# Patient Record
Sex: Male | Born: 1960
Health system: Southern US, Community
[De-identification: ages and names within clinical notes are randomized; demographics above are authoritative.]

## PROBLEM LIST (undated history)

## (undated) DIAGNOSIS — I10 Essential (primary) hypertension: Secondary | ICD-10-CM

## (undated) DIAGNOSIS — K5900 Constipation, unspecified: Secondary | ICD-10-CM

## (undated) DIAGNOSIS — K219 Gastro-esophageal reflux disease without esophagitis: Secondary | ICD-10-CM

## (undated) DIAGNOSIS — K649 Unspecified hemorrhoids: Secondary | ICD-10-CM

## (undated) DIAGNOSIS — E785 Hyperlipidemia, unspecified: Secondary | ICD-10-CM

## (undated) DIAGNOSIS — N189 Chronic kidney disease, unspecified: Secondary | ICD-10-CM

## (undated) HISTORY — DX: Gastro-esophageal reflux disease without esophagitis: K21.9

## (undated) HISTORY — DX: Chronic kidney disease, unspecified: N18.9

## (undated) HISTORY — DX: Essential (primary) hypertension: I10

## (undated) HISTORY — DX: Hyperlipidemia, unspecified: E78.5

## (undated) HISTORY — DX: Constipation, unspecified: K59.00

## (undated) HISTORY — PX: MULTIPLE TOOTH EXTRACTIONS: SHX2053

## (undated) HISTORY — DX: Unspecified hemorrhoids: K64.9

## (undated) HISTORY — PX: NO PAST SURGERIES: SHX2092

---

## 2019-11-19 DIAGNOSIS — Z0289 Encounter for other administrative examinations: Secondary | ICD-10-CM | POA: Insufficient documentation

## 2019-11-21 ENCOUNTER — Other Ambulatory Visit: Payer: Self-pay

## 2019-11-21 ENCOUNTER — Ambulatory Visit (INDEPENDENT_AMBULATORY_CARE_PROVIDER_SITE_OTHER): Payer: Medicaid Other | Admitting: Family Medicine

## 2019-11-21 VITALS — BP 152/92 | HR 94 | Ht 64.5 in | Wt 181.6 lb

## 2019-11-21 DIAGNOSIS — Z0289 Encounter for other administrative examinations: Secondary | ICD-10-CM | POA: Diagnosis present

## 2019-11-21 DIAGNOSIS — Z1211 Encounter for screening for malignant neoplasm of colon: Secondary | ICD-10-CM

## 2019-11-21 DIAGNOSIS — I10 Essential (primary) hypertension: Secondary | ICD-10-CM | POA: Insufficient documentation

## 2019-11-21 DIAGNOSIS — G8929 Other chronic pain: Secondary | ICD-10-CM

## 2019-11-21 DIAGNOSIS — Z8619 Personal history of other infectious and parasitic diseases: Secondary | ICD-10-CM | POA: Insufficient documentation

## 2019-11-21 DIAGNOSIS — K219 Gastro-esophageal reflux disease without esophagitis: Secondary | ICD-10-CM

## 2019-11-21 DIAGNOSIS — K5904 Chronic idiopathic constipation: Secondary | ICD-10-CM | POA: Insufficient documentation

## 2019-11-21 DIAGNOSIS — M545 Low back pain: Secondary | ICD-10-CM

## 2019-11-21 LAB — POCT URINALYSIS DIP (MANUAL ENTRY)
Bilirubin, UA: NEGATIVE
Blood, UA: NEGATIVE
Glucose, UA: NEGATIVE mg/dL
Ketones, POC UA: NEGATIVE mg/dL
Leukocytes, UA: NEGATIVE
Nitrite, UA: NEGATIVE
Protein Ur, POC: NEGATIVE mg/dL
Spec Grav, UA: 1.02 (ref 1.010–1.025)
Urobilinogen, UA: 0.2 E.U./dL
pH, UA: 5.5 (ref 5.0–8.0)

## 2019-11-21 MED ORDER — AMLODIPINE BESYLATE 5 MG PO TABS: 5.0000 mg | ORAL_TABLET | Freq: Every day | ORAL | 1 refills | Status: DC

## 2019-11-21 MED ORDER — LISINOPRIL-HYDROCHLOROTHIAZIDE 20-25 MG PO TABS: 1.0000 | ORAL_TABLET | Freq: Every day | ORAL | 1 refills | Status: DC

## 2019-11-21 MED ORDER — POLYETHYLENE GLYCOL 3350 17 G PO PACK: 17.0000 g | PACK | Freq: Every day | ORAL | 0 refills | Status: DC

## 2019-11-21 MED ORDER — ACETAMINOPHEN ER 650 MG PO TBCR: 650.0000 mg | EXTENDED_RELEASE_TABLET | Freq: Three times a day (TID) | ORAL | 1 refills | Status: DC | PRN

## 2019-11-21 MED ORDER — CALCIUM CARBONATE ANTACID 500 MG PO CHEW: 1.0000 | CHEWABLE_TABLET | Freq: Every day | ORAL | 1 refills | Status: DC

## 2019-11-21 NOTE — Assessment & Plan Note (Signed)
Refugee examination reviewed.  History reviewed and updated.  Labs ordered.  Patient will need to go to health department for TB testing.  Will need vaccinations at follow-up visit.

## 2019-11-21 NOTE — Assessment & Plan Note (Signed)
Uses suppositories as needed.  Will trial MiraLAX.  Patient is also never had a colonoscopy, will refer him for this especially while he has insurance.  Social worker to help schedule colonoscopy.

## 2019-11-21 NOTE — Assessment & Plan Note (Signed)
Patient mentioned at the end of visit.  Does not appear to be changed in recent years.  For this reason, can hold off till next visit.  For now will prescribe Tylenol as needed.  Will need formal examination at next visit.

## 2019-11-21 NOTE — Progress Notes (Signed)
Patient Name: David Owen Date of Birth: 05/31/1960 Date of Visit: 11/21/19 PCP: ,  J, DO  Chief Complaint: refugee intake examination and HTN, acid reflux   The patient's preferred language is French and Swahili. An interpreter was used for the entire visit.  Interpreter Name or ID: Jocelyne, in-person   Prefers French interpretor   Subjective: David Owen is a pleasant 59 y.o. presenting today for an initial refugee and immigrant clinic visit.    ROS: Sometimes has teary eyes and gets blurry with this.  No chest pain, shortness of breath.  No fevers, chills, large changes in weight.  No vomiting or diarrhea.  No rashes.   PMH:  HTN, diagnosed 6 years.  Has been on combo pill with amlodipine 5/losartan50/HCTZ50 Has never been on other medications for BP and no history of intolerance of medication Denies chest pain or shortness of breath No leg swelling  Acid Reflux has a history of reflux, has never been on medication for it, no OTC meds Has had symptoms for a very long times Comes and goes occasionally   Syphilis, found on overseas lab work, treated with PCN x3 Never had any symptoms  Hemorrhoid, history of them Has used suppository for constipation for avoid hemorrhoids Uses only when he is constipated and has pain Has never had a colonoscopy  Chronic back pain Patient reports that his low back pain, usually bilateral, very infrequent, has been happening for many years without changes He would like to discuss in further detail at his next appointment  PSH: None   FH: None in children or parents  Current Medications:  Amlodipine 5mg/Losartan 50mg/HCTZ 50mg   Refugee Information Number of Immediate Family Members: 6 Number of Immediate Family Members in [place]: 6 Date of Arrival: 11/09/19 [place] Location of Refugee Camp: Other Other Location of Refugee Camp:: [place] Duration in Camp: 20 years or  greater Reason for Leaving Home Country: Other Other Reason for Leaving Home Country:: War Primary Language: French, Swahili/Kiswahili Able to Read in Primary Language: Yes Able to Write in Primary Language: Yes Education: College Prior Work: Architectural Design and Civil Engineering Marital Status: Married Sexual Activity: Yes Tuberculosis Screening Overseas: Not Completed Tuberculosis Screening Health Department: Not Completed Health Department Labs Completed: No History of Trauma: None Do You Feel Jumpy or Nervous?: No Are You Very Watchful or 'Super Alert'?: No   Date of Overseas Exam: 01/24/2019 Review of [place]verse[place] Exam: 11/21/2019 Pre-Departure Treatment: See attestation    Vitals:   11/21/19 0943  BP: (!) 152/92  Pulse: 94  SpO2: 99%   HEENT: Sclera anicteric. Dentition is decent . Appears well hydrated. Neck: Supple, no thyromegaly, no cervical LAD Cardiac: Regular rate and rhythm. Normal S1/S2. No murmurs, rubs, or gallops appreciated. Lungs: Clear bilaterally to ascultation.  Abdomen: Normoactive bowel sounds. No tenderness to deep or light palpation. No rebound or guarding.  No splenomegaly Extremities: Warm, well perfused without edema.  Skin: Warm and dry, no rashes Psych: Pleasant and appropriate  MSK: Moves all 4 extremities equally, no lower extremity edema  David Owen was seen today for establish care.  Diagnoses and all orders for this visit:  Refugee health examination -     RPR -     HIV Antibody (routine testing w rflx) -     Hepatitis c antibody (reflex) -     HGB FRAC. W/SOLUBILITY -     CBC with Differential/Platelet -     Comprehensive   metabolic panel -     Varicella zoster antibody, IgG -     Hepatitis B surface antibody,quantitative -     POCT urinalysis dipstick -     Lipid panel -     TSH -     Hepatitis B core antibody, total -     Hepatitis B surface antigen -     Strongyloides, Ab, IgG -     POCT urinalysis dipstick  Essential  hypertension -     lisinopril-hydrochlorothiazide (ZESTORETIC) 20-25 MG tablet; Take 1 tablet by mouth daily. -     amLODipine (NORVASC) 5 MG tablet; Take 1 tablet (5 mg total) by mouth at bedtime.  History of syphilis  Gastroesophageal reflux disease, unspecified whether esophagitis present -     calcium carbonate (TUMS) 500 MG chewable tablet; Chew 1 tablet (200 mg of elemental calcium total) by mouth daily.  Screening for colon cancer -     Ambulatory referral to Gastroenterology  Chronic idiopathic constipation -     polyethylene glycol (MIRALAX) 17 g packet; Take 17 g by mouth daily.  Chronic bilateral low back pain without sciatica -     acetaminophen (TYLENOL 8 HOUR) 650 MG CR tablet; Take 1 tablet (650 mg total) by mouth every 8 (eight) hours as needed for pain.   Refugee health examination Refugee examination reviewed.  History reviewed and updated.  Labs ordered.  Patient will need to go to health department for TB testing.  Will need vaccinations at follow-up visit.  Essential hypertension BP not currently well controlled.  Will switch to lisinopril/hydrochlorothiazide 20/25 mg daily and Norvasc 5 mg daily..  Will check BMP with refugee examination labs.  Patient will follow up in 1 week.  We will continue to titrate as needed.  Gastroesophageal reflux disease Chronic issue.  Intermittent.  Will treat patient with Tums for now.  At next follow-up visit will perform urea breath test.  Patient also referred to GI for colonoscopy.  After urea breath test, could consider starting H2 blocker.  History of syphilis Treated with penicillin x3.  We will follow-up titer to ensure decrease from previous.  Was 1:4 at last check with overseas examination.  Chronic idiopathic constipation Uses suppositories as needed.  Will trial MiraLAX.  Patient is also never had a colonoscopy, will refer him for this especially while he has insurance.  Social worker to help schedule  colonoscopy.  Chronic bilateral low back pain without sciatica Patient mentioned at the end of visit.  Does not appear to be changed in recent years.  For this reason, can hold off till next visit.  For now will prescribe Tylenol as needed.  Will need formal examination at next visit.  Screening for colon cancer Colonoscopy form given to patient.  Social worker to help schedule colonoscopy.    Return to care in 1 week in [facility] with PCP   Vaccines: At follow up   Labs to Order if not completed at Health Department:   - CBC with differential (looking for eosinophilia) - CMET  - HIV - Hepatitis C for adults, + tattoo, substance use, other risk factors  - RPR (those 15 and over)  - Varicella IgG if 19 years or older, unless immunization documented - MMR titer (unless prior vaccination)  - Hepatitis B surface antigen, surface antibody, and core antibody  - GC/CT if sexually active or risk factors  - Quantiferon or TST  - Lipids - HbA1C if overweight or obese  - B12 if  Bhutanese or Nepalese  - Sickle cell screen  - Urine dipstick  - Age 6-16, lead (repeat 3-6 months later)     

## 2019-11-21 NOTE — Assessment & Plan Note (Signed)
BP not currently well controlled.  Will switch to lisinopril/hydrochlorothiazide 20/25 mg daily and Norvasc 5 mg daily..  Will check BMP with refugee examination labs.  Patient will follow up in 1 week.  We will continue to titrate as needed.

## 2019-11-21 NOTE — Patient Instructions (Addendum)
Thank you for coming to see me today. It was a pleasure. Today we talked about:   Stop taking your old blood pressure medicine and start taking your new medication.  You will have two different medicines and you will take both of them once a day.  You can use Tums daily as needed for reflux.  Your social worker will help you schedule your colonoscopy.  Please follow-up with me in 1 week.  If you have any questions or concerns, please do not hesitate to call the office at [phone].  Best,   Arizona Constable, DO     [facility] - for constipation.  Take once daily if constipated and if doesn't help, can increase to twice a day.

## 2019-11-21 NOTE — Addendum Note (Signed)
Addended by: Owens Shark, Kenitha Glendinning on: 11/21/2019 03:34 PM   Modules accepted: Orders

## 2019-11-21 NOTE — Assessment & Plan Note (Signed)
Colonoscopy form given to patient.  Social worker to help schedule colonoscopy.

## 2019-11-21 NOTE — Assessment & Plan Note (Signed)
Treated with penicillin x3.  We will follow-up titer to ensure decrease from previous.  Was 1:4 at last check with overseas examination.

## 2019-11-21 NOTE — Assessment & Plan Note (Signed)
Chronic issue.  Intermittent.  Will treat patient with Tums for now.  At next follow-up visit will perform urea breath test.  Patient also referred to GI for colonoscopy.  After urea breath test, could consider starting H2 blocker.

## 2019-11-22 LAB — VARICELLA ZOSTER ANTIBODY, IGG: Varicella zoster IgG: 468 index (ref >165)

## 2019-11-22 LAB — COMPREHENSIVE METABOLIC PANEL
ALT: 43 IU/L (ref 0–44)
AST: 24 IU/L (ref 0–40)
Albumin/Globulin Ratio: 1.8 (ref 1.2–2.2)
Albumin: 4.7 g/dL (ref 3.8–4.9)
Alkaline Phosphatase: 119 IU/L (ref 48–121)
BUN/Creatinine Ratio: 13 (ref 9–20)
BUN: 17 mg/dL (ref 6–24)
Bilirubin Total: 0.2 mg/dL (ref 0.0–1.2)
CO2: 25 mmol/L (ref 20–29)
Calcium: 10.4 mg/dL — ABNORMAL HIGH (ref 8.7–10.2)
Chloride: 97 mmol/L (ref 96–106)
Creatinine, Ser: 1.33 mg/dL — ABNORMAL HIGH (ref 0.76–1.27)
GFR calc Af Amer: 67 mL/min/{1.73_m2} (ref >59)
GFR calc non Af Amer: 58 mL/min/{1.73_m2} — ABNORMAL LOW (ref >59)
Globulin, Total: 2.6 g/dL (ref 1.5–4.5)
Glucose: 108 mg/dL — ABNORMAL HIGH (ref 65–99)
Potassium: 3.6 mmol/L (ref 3.5–5.2)
Sodium: 140 mmol/L (ref 134–144)
Total Protein: 7.3 g/dL (ref 6.0–8.5)

## 2019-11-22 LAB — CBC WITH DIFFERENTIAL/PLATELET
Basophils Absolute: 0.1 10*3/uL (ref 0.0–0.2)
Basos: 1 %
EOS (ABSOLUTE): 0.2 10*3/uL (ref 0.0–0.4)
Eos: 3 %
Hematocrit: 48.5 % (ref 37.5–51.0)
Hemoglobin: 16.6 g/dL (ref 13.0–17.7)
Immature Grans (Abs): 0 10*3/uL (ref 0.0–0.1)
Immature Granulocytes: 0 %
Lymphocytes Absolute: 2.2 10*3/uL (ref 0.7–3.1)
Lymphs: 31 %
MCH: 29.1 pg (ref 26.6–33.0)
MCHC: 34.2 g/dL (ref 31.5–35.7)
MCV: 85 fL (ref 79–97)
Monocytes Absolute: 0.6 10*3/uL (ref 0.1–0.9)
Monocytes: 9 %
Neutrophils Absolute: 3.8 10*3/uL (ref 1.4–7.0)
Neutrophils: 56 %
Platelets: 250 10*3/uL (ref 150–450)
RBC: 5.7 x10E6/uL (ref 4.14–5.80)
RDW: 14.2 % (ref 11.6–15.4)
WBC: 6.9 10*3/uL (ref 3.4–10.8)

## 2019-11-22 LAB — HEPATITIS B SURFACE ANTIBODY, QUANTITATIVE: Hepatitis B Surf Ab Quant: 175.1 m[IU]/mL (ref >9.9)

## 2019-11-22 LAB — LIPID PANEL
Chol/HDL Ratio: 6.4 ratio — ABNORMAL HIGH (ref 0.0–5.0)
Cholesterol, Total: 263 mg/dL — ABNORMAL HIGH (ref 100–199)
HDL: 41 mg/dL (ref >39)
LDL Chol Calc (NIH): 142 mg/dL — ABNORMAL HIGH (ref 0–99)
Triglycerides: 432 mg/dL — ABNORMAL HIGH (ref 0–149)
VLDL Cholesterol Cal: 80 mg/dL — ABNORMAL HIGH (ref 5–40)

## 2019-11-22 LAB — HEPATITIS B CORE ANTIBODY, TOTAL: Hep B Core Total Ab: NEGATIVE

## 2019-11-22 LAB — HIV ANTIBODY (ROUTINE TESTING W REFLEX): HIV Screen 4th Generation wRfx: NONREACTIVE

## 2019-11-22 LAB — RPR: RPR Ser Ql: NONREACTIVE

## 2019-11-22 LAB — HEPATITIS B SURFACE ANTIGEN: Hepatitis B Surface Ag: NEGATIVE

## 2019-11-22 LAB — TSH: TSH: 2.54 u[IU]/mL (ref 0.450–4.500)

## 2019-11-23 ENCOUNTER — Encounter: Payer: Self-pay | Admitting: Internal Medicine

## 2019-11-23 LAB — HGB FRACTIONATION CASCADE
Hgb A2: 3.2 % (ref 1.8–3.2)
Hgb A: 96.8 % (ref 96.4–98.8)
Hgb F: 0 % (ref 0.0–2.0)
Hgb S: 0 %

## 2019-11-23 LAB — HCV INTERPRETATION

## 2019-11-23 LAB — HCV AB W REFLEX TO QUANT PCR: HCV Ab: <0.1 s/co ratio (ref 0.0–0.9)

## 2019-11-24 LAB — STRONGYLOIDES, AB, IGG: Strongyloides, Ab, IgG: NEGATIVE

## 2019-11-27 NOTE — Progress Notes (Signed)
SUBJECTIVE:   CHIEF COMPLAINT / HPI:   Hypertension Patient changed from overseas medication to lisinopril/hydrochlorothiazide 20/25 mg daily and Norvasc 5 mg daily He reports that he has been able to get this and is taking every day BMP was within normal limits at last office visit No chest pain, SOB, leg edema  GERD Patient referred to GI for colonoscopy, also given Tums SW working on getting colonoscopy scheduled Has not tried the tums Has "indigestion" after he eats Has no abdominal pain with this Doesn't have this often and is more concerned about his constipation  Constipation Continues to have constipation Rarely has BMs Has abdominal pain and hemorrhoids from this Was not able to find miralax at the pharmacy  Hyperlipidemia ASCVD risk 18.3, moderate intensity statin recommended No chest pain or shortness of breath Has never been on cholesterol medication before  CKD Patient with CKD 3a on labs at last visit  Left Ear Pain Inside ear, for 2 days Having headache with this No fevers, congestion Has not been swimming or gotten anything in ear that he knows of Feels itching inside the ear Has not tried any medication for this  **CC was Back Pain, but patient states this is chronic and very rare, he does not want to talk about this today after agenda setting**  iPad [place] interpretor used for entirety of encounter  PERTINENT  PMH / PSH: Hypertension, acid reflux, syphilis previously treated, hemorrhoids and constipation, chronic back pain  OBJECTIVE:   BP (!) 158/106   Pulse 76   Ht 5' 4.5" (1.638 m)   Wt 182 lb 3.2 oz (82.6 kg)   SpO2 98%   BMI 30.79 kg/m    Physical Exam:  General: 59 y.o. male in NAD HEENT: Right TM clear, Left TM mildly erythematous, bulging, effusion, no pain with palpation of left mastoid process Neck: No cervical lymphadenopathy Cardio: RRR no m/r/g Lungs: CTAB, no wheezing, no rhonchi, no crackles, no IWOB on RA Skin:  warm and dry Extremities: No edema   ASSESSMENT/PLAN:   Essential hypertension BP today not yet at goal.  For ease of patient, will double Norvasc dose to 10 mg at this time.  Continue lisinopril/hydrochlorothiazide 20--25 mg daily.  Ensured that patient was aware on how to double Norvasc and that 10 mg tablets would be sent in for him to use after completion of his current bottle.  BMP was within normal limits at last check.  We will have him follow-up on 8/2.  Gastroesophageal reflux disease Patient has not tried Tums as he has not had symptoms of this.  He is more worried about his constipation.  We will treat constipation, if his continues can consider treating with H2 blocker and urea breath test.  Chronic idiopathic constipation Patient provided a picture of MiraLAX.  He is also been referred for colonoscopy which appears to be scheduled.  Mixed hyperlipidemia Patient's ASCVD risk requiring moderate intensity statin.  Discussed with patient.  He is agreeable to starting cholesterol medication.  Will start Lipitor 20 mg.  Plan to recheck LDL in 3 months.  Patient advised that if he experiences leg cramps to let me know.  Stage 3a chronic kidney disease BMP showed likely CKD 3a.  Given that we have lack of prior history on patient, plan to repeat BMP at his next visit.  If this remains in CKD 3 range, will likely go ahead and refer patient to nephrology as he has guaranteed Medicaid for 8 months.  Non-recurrent acute suppurative otitis media of left ear without spontaneous rupture of tympanic membrane Patient does appear to have a left-sided effusion and erythematous TM consistent with acute otitis media.  He is otherwise well-appearing, no evidence of mastoiditis.  Will treat for simple adult acute otitis media with Augmentin x7 days.  If he does not have improvement or if this is recurrent, would consider imaging and ENT referral at that time.     Cleophas Dunker, [place]

## 2019-11-28 ENCOUNTER — Encounter: Payer: Self-pay | Admitting: Family Medicine

## 2019-11-28 ENCOUNTER — Other Ambulatory Visit: Payer: Self-pay

## 2019-11-28 ENCOUNTER — Ambulatory Visit (INDEPENDENT_AMBULATORY_CARE_PROVIDER_SITE_OTHER): Payer: Medicaid Other | Admitting: Family Medicine

## 2019-11-28 VITALS — BP 158/106 | HR 76 | Ht 64.5 in | Wt 182.2 lb

## 2019-11-28 DIAGNOSIS — K219 Gastro-esophageal reflux disease without esophagitis: Secondary | ICD-10-CM | POA: Diagnosis not present

## 2019-11-28 DIAGNOSIS — K5904 Chronic idiopathic constipation: Secondary | ICD-10-CM

## 2019-11-28 DIAGNOSIS — I10 Essential (primary) hypertension: Secondary | ICD-10-CM

## 2019-11-28 DIAGNOSIS — N1831 Chronic kidney disease, stage 3a: Secondary | ICD-10-CM | POA: Diagnosis not present

## 2019-11-28 DIAGNOSIS — E782 Mixed hyperlipidemia: Secondary | ICD-10-CM | POA: Diagnosis not present

## 2019-11-28 DIAGNOSIS — H66002 Acute suppurative otitis media without spontaneous rupture of ear drum, left ear: Secondary | ICD-10-CM | POA: Diagnosis not present

## 2019-11-28 MED ORDER — AMOXICILLIN-POT CLAVULANATE 875-125 MG PO TABS: 1.0000 | ORAL_TABLET | Freq: Two times a day (BID) | ORAL | 0 refills | Status: AC

## 2019-11-28 MED ORDER — ATORVASTATIN CALCIUM 20 MG PO TABS: 20.0000 mg | ORAL_TABLET | Freq: Every day | ORAL | 3 refills | Status: DC

## 2019-11-28 MED ORDER — AMLODIPINE BESYLATE 10 MG PO TABS: 10.0000 mg | ORAL_TABLET | Freq: Every day | ORAL | 3 refills | Status: DC

## 2019-11-28 NOTE — Assessment & Plan Note (Signed)
Patient's ASCVD risk requiring moderate intensity statin.  Discussed with patient.  He is agreeable to starting cholesterol medication.  Will start Lipitor 20 mg.  Plan to recheck LDL in 3 months.  Patient advised that if he experiences leg cramps to let me know.

## 2019-11-28 NOTE — Assessment & Plan Note (Signed)
Patient has not tried Tums as he has not had symptoms of this.  He is more worried about his constipation.  We will treat constipation, if his continues can consider treating with H2 blocker and urea breath test.

## 2019-11-28 NOTE — Assessment & Plan Note (Signed)
BP today not yet at goal.  For ease of patient, will double Norvasc dose to 10 mg at this time.  Continue lisinopril/hydrochlorothiazide 20--25 mg daily.  Ensured that patient was aware on how to double Norvasc and that 10 mg tablets would be sent in for him to use after completion of his current bottle.  BMP was within normal limits at last check.  We will have him follow-up on 8/2.

## 2019-11-28 NOTE — Assessment & Plan Note (Signed)
Patient does appear to have a left-sided effusion and erythematous TM consistent with acute otitis media.  He is otherwise well-appearing, no evidence of mastoiditis.  Will treat for simple adult acute otitis media with Augmentin x7 days.  If he does not have improvement or if this is recurrent, would consider imaging and ENT referral at that time.

## 2019-11-28 NOTE — Assessment & Plan Note (Signed)
Patient provided a picture of MiraLAX.  He is also been referred for colonoscopy which appears to be scheduled.

## 2019-11-28 NOTE — Assessment & Plan Note (Signed)
BMP showed likely CKD 3a.  Given that we have lack of prior history on patient, plan to repeat BMP at his next visit.  If this remains in CKD 3 range, will likely go ahead and refer patient to nephrology as he has guaranteed Medicaid for 8 months.

## 2019-11-28 NOTE — Patient Instructions (Addendum)
Thank you for coming to see me today. It was a pleasure. Today we talked about:   Take two amlodipine (total of 10mg ) once a day.  Continue to take lisinopril-hydrochlorothiazide one pill once daily.  When you run out of amlodipine, there is a new prescription at the pharmacy that is for 10mg  tablets.  Take one pill once a day when you switch to this.  We have sent a new medicine to the pharmacy for your cholesterol.  Take this every day.  I have sent an antibiotic to the pharmacy for your ear infection.  Please follow-up with me  If you have any questions or concerns, please do not hesitate to call the office at [phone].  Best,   Arizona Constable, DO  for constipation

## 2019-12-18 ENCOUNTER — Ambulatory Visit (INDEPENDENT_AMBULATORY_CARE_PROVIDER_SITE_OTHER): Payer: Medicaid Other | Admitting: Family Medicine

## 2019-12-18 ENCOUNTER — Encounter: Payer: Self-pay | Admitting: Family Medicine

## 2019-12-18 ENCOUNTER — Other Ambulatory Visit: Payer: Self-pay

## 2019-12-18 VITALS — BP 145/80 | HR 100 | Ht 65.0 in | Wt 186.4 lb

## 2019-12-18 DIAGNOSIS — K5904 Chronic idiopathic constipation: Secondary | ICD-10-CM | POA: Diagnosis not present

## 2019-12-18 DIAGNOSIS — N1831 Chronic kidney disease, stage 3a: Secondary | ICD-10-CM

## 2019-12-18 DIAGNOSIS — I10 Essential (primary) hypertension: Secondary | ICD-10-CM | POA: Diagnosis not present

## 2019-12-18 DIAGNOSIS — H04123 Dry eye syndrome of bilateral lacrimal glands: Secondary | ICD-10-CM | POA: Diagnosis not present

## 2019-12-18 MED ORDER — RESTASIS 0.05 % OP EMUL: 1.0000 [drp] | Freq: Two times a day (BID) | OPHTHALMIC | 1 refills | Status: DC

## 2019-12-18 NOTE — Assessment & Plan Note (Signed)
Was doing well with MiraLAX, but ran out.  Encourage patient to obtain more.  Advised that he can take daily, and back off if stools become too soft.  Patient is also scheduled for colonoscopy on 9/7.

## 2019-12-18 NOTE — Patient Instructions (Addendum)
We will get labs today.  Bring your vaccinations to your next appointment.  Come back on 9/7 at 1:30pm to see me.  Get the eye drops from the pharmacy to use twice a day in both eyes.  I have placed a referral to the Eye doctor.  If you do not hear from them in the next 2 weeks, please give [place] a call.  You can use Tylenol 650mg  every 6 hours as needed for pain You can use Tums as needed for your stomach feeling upset

## 2019-12-18 NOTE — Assessment & Plan Note (Signed)
Given that it is unsure if he has a history of CKD, will recheck labs today.  Of note, patient has not been taking lisinopril-hydrochlorothiazide, therefore this would not be contributing to any increase in CKD.

## 2019-12-18 NOTE — Assessment & Plan Note (Signed)
BP not at goal today, 145/80.  Patient has not been taking lisinopril-hydrochlorothiazide as prescribed, only Norvasc.  Encourage patient to take both medications.  We will follow-up in 1 month.  BMP today.

## 2019-12-18 NOTE — Assessment & Plan Note (Addendum)
Symptoms seem consistent with dry eyes, but given that patient has hypertension and has not had an eye exam, will go ahead and refer him to ophthalmology while he has guaranteed insurance.  Can use Restasis twice daily.

## 2019-12-18 NOTE — Progress Notes (Addendum)
    SUBJECTIVE:   CHIEF COMPLAINT / HPI:   CKD 3a Patient with CKD 3a at last lab check, without other labs, will get BMP today to compare  HTN Current regimen: norvasc 10mg , Lisinopril-HCTZ 20-25mg  QD Increased norvasc from 5 to 10 at last visit Has not been taking lisinopril-HCTZ as he states that he didn't know how to take it Denies CP, SOB, leg edema, symptoms of hypotension Patient reports that he has been checking BP at home in the AM States it is usually 145/80, 150/90 Lowest number that he has seen 130/80  Vaccinations Patient needs refugee vaccinations States that they received vaccinations a week ago but didn't bring the list  Constipation Has been taking miralax, but ran out Is scheduled for a colonoscopy  Eye Pain Sometimes has pain and tears come from his eyes at times  PERTINENT  PMH / PSH:  Hypertension, acid reflux, syphilis previously treated, hemorrhoids and constipation, chronic back pain Hypertension, acid reflux, syphilis previously treated, hemorrhoids and constipation, chronic back pain  OBJECTIVE:   BP (!) 145/80   Pulse 100   Ht 5\' 5"  (1.651 m)   Wt 186 lb 6.4 oz (84.6 kg)   SpO2 99%   BMI 31.02 kg/m    Physical Exam:  General: 59 y.o. male in NAD Cardio: RRR no m/r/g Lungs: CTAB, no wheezing, no rhonchi, no crackles, no IWOB on RA Skin: warm and dry Extremities: No edema   ASSESSMENT/PLAN:   Essential hypertension BP not at goal today, 145/80.  Patient has not been taking lisinopril-hydrochlorothiazide as prescribed, only Norvasc.  Encourage patient to take both medications.  We will follow-up in 1 month.  BMP today.  Stage 3a chronic kidney disease Given that it is unsure if he has a history of CKD, will recheck labs today.  Of note, patient has not been taking lisinopril-hydrochlorothiazide, therefore this would not be contributing to any increase in CKD.  Chronic idiopathic constipation Was doing well with MiraLAX, but ran out.   Encourage patient to obtain more.  Advised that he can take daily, and back off if stools become too soft.  Patient is also scheduled for colonoscopy on 9/7.  Dry eye syndrome of both eyes Symptoms seem consistent with dry eyes, but given that patient has hypertension and has not had an eye exam, will go ahead and refer him to ophthalmology while he has guaranteed insurance.  Can use Restasis twice daily.     Patient states that he just received vaccinations last week, would like to hold off and bring in his records at the next visit to see what vaccines he needs.  [place] interpreter and Swahili interpreter used at different times to speak with patient throughout encounter with him and his wife.  Cleophas Dunker, [place]

## 2019-12-19 LAB — BASIC METABOLIC PANEL
BUN/Creatinine Ratio: 13 (ref 9–20)
BUN: 15 mg/dL (ref 6–24)
CO2: 23 mmol/L (ref 20–29)
Calcium: 10.1 mg/dL (ref 8.7–10.2)
Chloride: 103 mmol/L (ref 96–106)
Creatinine, Ser: 1.2 mg/dL (ref 0.76–1.27)
GFR calc Af Amer: 76 mL/min/{1.73_m2} (ref >59)
GFR calc non Af Amer: 66 mL/min/{1.73_m2} (ref >59)
Glucose: 123 mg/dL — ABNORMAL HIGH (ref 65–99)
Potassium: 4.2 mmol/L (ref 3.5–5.2)
Sodium: 142 mmol/L (ref 134–144)

## 2020-01-09 ENCOUNTER — Ambulatory Visit (AMBULATORY_SURGERY_CENTER): Payer: Self-pay | Admitting: *Deleted

## 2020-01-09 ENCOUNTER — Other Ambulatory Visit: Payer: Self-pay

## 2020-01-09 VITALS — Ht 65.0 in | Wt 184.0 lb

## 2020-01-09 DIAGNOSIS — Z1211 Encounter for screening for malignant neoplasm of colon: Secondary | ICD-10-CM

## 2020-01-09 DIAGNOSIS — Z01818 Encounter for other preprocedural examination: Secondary | ICD-10-CM

## 2020-01-09 MED ORDER — SUPREP BOWEL PREP KIT 17.5-3.13-1.6 GM/177ML PO SOLN: 1.0000 | Freq: Once | ORAL | 0 refills | Status: AC

## 2020-01-09 MED ORDER — POLYETHYLENE GLYCOL 3350 17 GM/SCOOP PO POWD: 1.0000 | Freq: Every day | ORAL | 0 refills | Status: DC

## 2020-01-09 NOTE — Progress Notes (Signed)
No egg or soy allergy known to patient  No issues with past sedation with any surgeries or procedures no intubation problems in the past  No FH of Malignant Hyperthermia No diet pills per patient No home 02 use per patient  No blood thinners per patient  Pt denies issues with constipation  No A fib or A flutter  EMMI video to pt or via [place] 19 guidelines implemented in [place] today with Pt and RN   cov test 9-2 Thursday 12 noon- Social Worker SArah will take pt to [employer] test = pt states his daughter reads english- he will have her read the instructions and call with any questions   Interpreter in [place] todayRica Mote to interpret   Due to the COVID-19 pandemic we are asking patients to follow these guidelines. Please only bring one care partner. Please be aware that your care partner may wait in the car in the parking lot or if they feel like they will be too hot to wait in the car, they may wait in the lobby on the 4th floor. All care partners are required to wear a mask the entire time (we do not have any that we can provide them), they need to practice social distancing, and we will do a Covid check for all patient's and care partners when you arrive. Also we will check their temperature and your temperature. If the care partner waits in their car they need to stay in the parking lot the entire time and we will call them on their cell phone when the patient is ready for discharge so they can bring the car to the front of the building. Also all patient's will need to wear a mask into building.

## 2020-01-17 DIAGNOSIS — I259 Chronic ischemic heart disease, unspecified: Secondary | ICD-10-CM

## 2020-01-17 DIAGNOSIS — R000 Tachycardia, unspecified: Secondary | ICD-10-CM

## 2020-01-17 HISTORY — PX: COLONOSCOPY: SHX174

## 2020-01-17 HISTORY — DX: Chronic ischemic heart disease, unspecified: I25.9

## 2020-01-17 HISTORY — DX: Tachycardia, unspecified: R00.0

## 2020-01-18 ENCOUNTER — Other Ambulatory Visit: Payer: Self-pay | Admitting: Internal Medicine

## 2020-01-18 ENCOUNTER — Ambulatory Visit (INDEPENDENT_AMBULATORY_CARE_PROVIDER_SITE_OTHER): Payer: Medicaid Other

## 2020-01-18 DIAGNOSIS — Z1159 Encounter for screening for other viral diseases: Secondary | ICD-10-CM

## 2020-01-18 LAB — SARS CORONAVIRUS 2 (TAT 6-24 HRS): SARS Coronavirus 2: NEGATIVE

## 2020-01-23 ENCOUNTER — Ambulatory Visit: Payer: Medicaid Other | Admitting: Family Medicine

## 2020-01-23 ENCOUNTER — Encounter: Payer: Medicaid Other | Admitting: Internal Medicine

## 2020-01-23 ENCOUNTER — Telehealth: Payer: Self-pay | Admitting: Internal Medicine

## 2020-01-25 ENCOUNTER — Other Ambulatory Visit: Payer: Self-pay

## 2020-01-25 ENCOUNTER — Encounter: Payer: Self-pay | Admitting: Family Medicine

## 2020-01-25 ENCOUNTER — Ambulatory Visit (INDEPENDENT_AMBULATORY_CARE_PROVIDER_SITE_OTHER): Payer: Medicaid Other | Admitting: Family Medicine

## 2020-01-25 VITALS — BP 148/96 | HR 100 | Ht 65.0 in | Wt 183.4 lb

## 2020-01-25 DIAGNOSIS — M940 Chondrocostal junction syndrome [Tietze]: Secondary | ICD-10-CM

## 2020-01-25 DIAGNOSIS — I10 Essential (primary) hypertension: Secondary | ICD-10-CM | POA: Diagnosis not present

## 2020-01-25 DIAGNOSIS — H04123 Dry eye syndrome of bilateral lacrimal glands: Secondary | ICD-10-CM

## 2020-01-25 MED ORDER — LISINOPRIL-HYDROCHLOROTHIAZIDE 20-25 MG PO TABS: 1.0000 | ORAL_TABLET | Freq: Every day | ORAL | 1 refills | Status: DC

## 2020-01-25 NOTE — Patient Instructions (Addendum)
Thank you for coming to see me today. It was a pleasure. Today we talked about:   Pick up your prescriptions from the pharmacy.  Take them for your blood pressure  We will get blood work today and talk about it at your next visit.  If chest pain occurs without touching, worsens, or moves, come in right away or go to the hospital.  Please follow-up with me in 1 month.  If you have any questions or concerns, please do not hesitate to call the office at [phone].  Best,   Ari[place] Constable, DO

## 2020-01-25 NOTE — Progress Notes (Signed)
    SUBJECTIVE:   CHIEF COMPLAINT / HPI:   HTN Current regimen: Lisinopril-HCTZ, Norvasc 10 Ran out of lisinpril-HCTZ 1 week ago BP at home 150/90 this AM, before running out of medication, it was 135/80 Denies chest pain, shortness of breath, leg edema, symptoms of hypotension  Colonoscopy was postponed as he didn't understand what to do about the prep He is rescheduled for 9/16  Dry Eyes  Referred to ophthalmology, was prescribed glasses which he is waiting on and received medication as well, Restasis  Chest Pain 2 weeks, only hurts when he presses in the area No pain with exertion, no pain at rest if he is not pushing on the area No shortness of breath No heavy lifting or new exercises No radiation of pain Pain is central to the costochondral junction  Received 1st COVID vaccine today at [facility] interpreter used for entirety of encounter  PERTINENT  PMH / PSH: HTN, acid reflux, history of syphilis previously treated, hemorrhoids and constipation, chronic back pain  OBJECTIVE:   BP (!) 148/96   Pulse 100   Ht 5\' 5"  (1.651 m)   Wt 183 lb 6.4 oz (83.2 kg)   SpO2 98%   BMI 30.52 kg/m    Physical Exam:  General: 59 y.o. male in NAD Cardio: RRR no m/r/g Lungs: CTAB, no wheezing, no rhonchi, no crackles, no IWOB on RA, tenderness to palpation of bilateral costochondral junction Skin: warm and dry Extremities: No edema   ASSESSMENT/PLAN:   Essential hypertension BP is not at goal today.  He has been without lisinopril hydrochlorothiazide for 1 week.  Explained at length have refills work to the patient so that he knows to obtain medication when he runs out from the pharmacy.  Patient reports that his blood pressure was previously controlled before running out of medication.  We will continue with current regimen, follow-up in 1 month.  Will obtain BMP today as he was taking lisinopril hydrochlorothiazide regularly before running out 1 week ago.  Dry eye syndrome of  both eyes Has been seen by ophthalmology.  States that he continues to use Restasis which is working well for his eyes.  Costochondritis Only occurs when pressing on his chest.  Ensured that this was verified with the patient multiple times.  He does not have any pain with exertion or at rest, only when pressing on his chest.  Has no associated symptoms.  Pain is reproducible to palpation on my exam as well and is located in the costochondral junction.  Most likely costochondritis.  Gave patient return precautions including worsening of pain, changing of character, pain that occurs without pressing, associated shortness of breath.  Advised to come to office or ED right away if this occurs.  Given that he is not having pain other than when he presses, advised patient to stop pressing on the area.  Could treat with a small burst of NSAIDs if needed if pain does not improve.     David Owen, [place]

## 2020-01-26 DIAGNOSIS — M940 Chondrocostal junction syndrome [Tietze]: Secondary | ICD-10-CM | POA: Insufficient documentation

## 2020-01-26 LAB — BASIC METABOLIC PANEL
BUN/Creatinine Ratio: 11 (ref 9–20)
BUN: 13 mg/dL (ref 6–24)
CO2: 24 mmol/L (ref 20–29)
Calcium: 10.3 mg/dL — ABNORMAL HIGH (ref 8.7–10.2)
Chloride: 103 mmol/L (ref 96–106)
Creatinine, Ser: 1.2 mg/dL (ref 0.76–1.27)
GFR calc Af Amer: 76 mL/min/{1.73_m2} (ref >59)
GFR calc non Af Amer: 66 mL/min/{1.73_m2} (ref >59)
Glucose: 87 mg/dL (ref 65–99)
Potassium: 4.3 mmol/L (ref 3.5–5.2)
Sodium: 143 mmol/L (ref 134–144)

## 2020-01-26 NOTE — Assessment & Plan Note (Signed)
BP is not at goal today.  He has been without lisinopril hydrochlorothiazide for 1 week.  Explained at length have refills work to the patient so that he knows to obtain medication when he runs out from the pharmacy.  Patient reports that his blood pressure was previously controlled before running out of medication.  We will continue with current regimen, follow-up in 1 month.  Will obtain BMP today as he was taking lisinopril hydrochlorothiazide regularly before running out 1 week ago.

## 2020-01-26 NOTE — Assessment & Plan Note (Signed)
Has been seen by ophthalmology.  States that he continues to use Restasis which is working well for his eyes.

## 2020-01-26 NOTE — Assessment & Plan Note (Signed)
Only occurs when pressing on his chest.  Ensured that this was verified with the patient multiple times.  He does not have any pain with exertion or at rest, only when pressing on his chest.  Has no associated symptoms.  Pain is reproducible to palpation on my exam as well and is located in the costochondral junction.  Most likely costochondritis.  Gave patient return precautions including worsening of pain, changing of character, pain that occurs without pressing, associated shortness of breath.  Advised to come to office or ED right away if this occurs.  Given that he is not having pain other than when he presses, advised patient to stop pressing on the area.  Could treat with a small burst of NSAIDs if needed if pain does not improve.

## 2020-01-30 ENCOUNTER — Other Ambulatory Visit: Payer: Self-pay | Admitting: Internal Medicine

## 2020-01-30 LAB — SARS CORONAVIRUS 2 (TAT 6-24 HRS): SARS Coronavirus 2: NEGATIVE

## 2020-02-01 ENCOUNTER — Encounter: Payer: Self-pay | Admitting: Internal Medicine

## 2020-02-01 ENCOUNTER — Ambulatory Visit (AMBULATORY_SURGERY_CENTER): Payer: Medicaid Other | Admitting: Internal Medicine

## 2020-02-01 ENCOUNTER — Other Ambulatory Visit: Payer: Self-pay

## 2020-02-01 VITALS — BP 132/61 | HR 110 | Temp 97.5°F | Resp 14 | Ht 65.0 in | Wt 184.0 lb

## 2020-02-01 DIAGNOSIS — Z1211 Encounter for screening for malignant neoplasm of colon: Secondary | ICD-10-CM

## 2020-02-01 DIAGNOSIS — D125 Benign neoplasm of sigmoid colon: Secondary | ICD-10-CM

## 2020-02-01 DIAGNOSIS — D123 Benign neoplasm of transverse colon: Secondary | ICD-10-CM

## 2020-02-01 MED ORDER — SODIUM CHLORIDE 0.9 % IV SOLN: 500.0000 mL | Freq: Once | INTRAVENOUS | Status: DC

## 2020-02-01 NOTE — Progress Notes (Signed)
Pt's states no medical or surgical changes since previsit or office visit. 

## 2020-02-01 NOTE — Progress Notes (Signed)
Called to room to assist during endoscopic procedure.  Patient ID and intended procedure confirmed with present staff. Received instructions for my participation in the procedure from the performing physician.  

## 2020-02-01 NOTE — Progress Notes (Signed)
PT taken to PACU. Monitors in place. VSS. Report given to RN. 

## 2020-02-01 NOTE — Op Note (Signed)
[facility] Patient Name: David Owen Procedure Date: 02/01/2020 8:31 AM MRN: [id number] Endoscopist: Docia Chuck. Henrene Pastor , MD Age: 59 Referring MD:  Date of Birth: [birth year] Gender: Male Account #: [id number] Procedure:                Colonoscopy with cold snare polypectomy x 2 Indications:              Screening for colorectal malignant neoplasm Medicines:                Monitored Anesthesia Care Procedure:                Pre-Anesthesia Assessment:                           - Prior to the procedure, a History and Physical                            was performed, and patient medications and                            allergies were reviewed. The patient's tolerance of                            previous anesthesia was also reviewed. The risks                            and benefits of the procedure and the sedation                            options and risks were discussed with the patient.                            All questions were answered, and informed consent                            was obtained. Prior Anticoagulants: The patient has                            taken no previous anticoagulant or antiplatelet                            agents. ASA Grade Assessment: II - A patient with                            mild systemic disease. After reviewing the risks                            and benefits, the patient was deemed in                            satisfactory condition to undergo the procedure.                           After obtaining informed consent, the colonoscope  was passed under direct vision. Throughout the                            procedure, the patient's blood pressure, pulse, and                            oxygen saturations were monitored continuously. The                            Colonoscope was introduced through the anus and                            advanced to the the cecum, identified by                             appendiceal orifice and ileocecal valve. The                            ileocecal valve, appendiceal orifice, and rectum                            were photographed. The quality of the bowel                            preparation was excellent. The colonoscopy was                            performed without difficulty. The patient tolerated                            the procedure well. The bowel preparation used was                            SUPREP via split dose instruction. Scope In: 8:42:21 AM Scope Out: 8:55:03 AM Scope Withdrawal Time: 0 hours 8 minutes 46 seconds  Total Procedure Duration: 0 hours 12 minutes 42 seconds  Findings:                 Two polyps were found in the sigmoid colon and                            transverse colon. The polyps were 2 to 4 mm in                            size. These polyps were removed with a cold snare.                            Resection and retrieval were complete.                           The exam was otherwise without abnormality on                            direct and retroflexion views. Complications:  No immediate complications. Estimated blood loss:                            None. Estimated Blood Loss:     Estimated blood loss: none. Impression:               - Two 2 to 4 mm polyps in the sigmoid colon and in                            the transverse colon, removed with a cold snare.                            Resected and retrieved.                           - The examination was otherwise normal on direct                            and retroflexion views. Recommendation:           - Repeat colonoscopy in 7 years for surveillance.                           - Patient has a contact number available for                            emergencies. The signs and symptoms of potential                            delayed complications were discussed with the                            patient. Return to normal activities  tomorrow.                            Written discharge instructions were provided to the                            patient.                           - Resume previous diet.                           - Continue present medications.                           - Await pathology results. Docia Chuck. Henrene Pastor, MD 02/01/2020 9:02:11 AM This report has been signed electronically.

## 2020-02-01 NOTE — Patient Instructions (Signed)
Please read handouts provided. Continue present medications. Await pathology results.   YOU HAD AN ENDOSCOPIC PROCEDURE TODAY AT THE [facility]:   Refer to the procedure report that was given to you for any specific questions about what was found during the examination.  If the procedure report does not answer your questions, please call your gastroenterologist to clarify.  If you requested that your care partner not be given the details of your procedure findings, then the procedure report has been included in a sealed envelope for you to review at your convenience later.  YOU SHOULD EXPECT: Some feelings of bloating in the abdomen. Passage of more gas than usual.  Walking can help get rid of the air that was put into your GI tract during the procedure and reduce the bloating. If you had a lower endoscopy (such as a colonoscopy or flexible sigmoidoscopy) you may notice spotting of blood in your stool or on the toilet paper. If you underwent a bowel prep for your procedure, you may not have a normal bowel movement for a few days.  Please Note:  You might notice some irritation and congestion in your nose or some drainage.  This is from the oxygen used during your procedure.  There is no need for concern and it should clear up in a day or so.  SYMPTOMS TO REPORT IMMEDIATELY:  Following lower endoscopy (colonoscopy or flexible sigmoidoscopy):  Excessive amounts of blood in the stool  Significant tenderness or worsening of abdominal pains  Swelling of the abdomen that is new, acute  Fever of 100F or higher   For urgent or emergent issues, a gastroenterologist can be reached at any hour by calling [phone]. Do not use MyChart messaging for urgent concerns.    DIET:  We do recommend a small meal at first, but then you may proceed to your regular diet.  Drink plenty of fluids but you should avoid alcoholic beverages for 24 hours.  ACTIVITY:  You should plan to take it easy  for the rest of today and you should NOT DRIVE or use heavy machinery until tomorrow (because of the sedation medicines used during the test).    FOLLOW UP: Our staff will call the number listed on your records 48-72 hours following your procedure to check on you and address any questions or concerns that you may have regarding the information given to you following your procedure. If we do not reach you, we will leave a message.  We will attempt to reach you two times.  During this call, we will ask if you have developed any symptoms of COVID 19. If you develop any symptoms (ie: fever, flu-like symptoms, shortness of breath, cough etc.) before then, please call [phone].  If you test positive for Covid 19 in the 2 weeks post procedure, please call and report this information to us.    If any biopsies were taken you will be contacted by phone or by letter within the next 1-3 weeks.  Please call us at [phone] if you have not heard about the biopsies in 3 weeks.    SIGNATURES/CONFIDENTIALITY: You and/or your care partner have signed paperwork which will be entered into your electronic medical record.  These signatures attest to the fact that that the information above on your After Visit Summary has been reviewed and is understood.  Full responsibility of the confidentiality of this discharge information lies with you and/or your care-partner.  

## 2020-02-05 ENCOUNTER — Telehealth: Payer: Self-pay

## 2020-02-05 NOTE — Telephone Encounter (Signed)
  Follow up Call-  Call back number 02/01/2020  Post procedure Call Back phone  # Philippa Chester (daughter) [phone]  Permission to leave phone message Yes     Patient questions:  Do you have a fever, pain , or abdominal swelling? No. Pain Score  0 *  Have you tolerated food without any problems? Yes.    Have you been able to return to your normal activities? Yes.    Do you have any questions about your discharge instructions: Diet   No. Medications  No. Follow up visit  No.  Do you have questions or concerns about your Care? No.  Actions: * If pain score is 4 or above: No action needed, pain <4.   1. Have you developed a fever since your procedure? No   2.   Have you had an respiratory symptoms (SOB or cough) since your procedure? No   3.   Have you tested positive for COVID 19 since your procedure? No   4.   Have you had any family members/close contacts diagnosed with the COVID 19 since your procedure?  No    If yes to any of these questions please route to Joylene John, RN and Joella Prince, RN

## 2020-02-05 NOTE — Telephone Encounter (Signed)
No answer on follow up call. 

## 2020-02-06 ENCOUNTER — Encounter: Payer: Self-pay | Admitting: Internal Medicine

## 2020-02-15 ENCOUNTER — Emergency Department (HOSPITAL_COMMUNITY)
Admission: EM | Admit: 2020-02-15 | Discharge: 2020-02-15 | Disposition: A | Discharge status: Home / Self Care | Payer: Medicaid Other | Attending: Emergency Medicine | Admitting: Emergency Medicine

## 2020-02-15 ENCOUNTER — Emergency Department (HOSPITAL_COMMUNITY): Payer: Medicaid Other

## 2020-02-15 DIAGNOSIS — N1831 Chronic kidney disease, stage 3a: Secondary | ICD-10-CM | POA: Diagnosis not present

## 2020-02-15 DIAGNOSIS — R0789 Other chest pain: Secondary | ICD-10-CM | POA: Insufficient documentation

## 2020-02-15 DIAGNOSIS — Z79899 Other long term (current) drug therapy: Secondary | ICD-10-CM | POA: Insufficient documentation

## 2020-02-15 DIAGNOSIS — I129 Hypertensive chronic kidney disease with stage 1 through stage 4 chronic kidney disease, or unspecified chronic kidney disease: Secondary | ICD-10-CM | POA: Diagnosis not present

## 2020-02-15 LAB — TROPONIN I (HIGH SENSITIVITY)
Troponin I (High Sensitivity): 7 ng/L (ref <18)
Troponin I (High Sensitivity): 6 ng/L (ref <18)

## 2020-02-15 LAB — BASIC METABOLIC PANEL
Anion gap: 11 (ref 5–15)
BUN: 16 mg/dL (ref 6–20)
CO2: 26 mmol/L (ref 22–32)
Calcium: 10.5 mg/dL — ABNORMAL HIGH (ref 8.9–10.3)
Chloride: 104 mmol/L (ref 98–111)
Creatinine, Ser: 1.43 mg/dL — ABNORMAL HIGH (ref 0.61–1.24)
GFR calc Af Amer: >60 mL/min (ref >60)
GFR calc non Af Amer: 53 mL/min — ABNORMAL LOW (ref >60)
Glucose, Bld: 152 mg/dL — ABNORMAL HIGH (ref 70–99)
Potassium: 4.3 mmol/L (ref 3.5–5.1)
Sodium: 141 mmol/L (ref 135–145)

## 2020-02-15 LAB — CBC
HCT: 43.4 % (ref 39.0–52.0)
Hemoglobin: 14.5 g/dL (ref 13.0–17.0)
MCH: 28.8 pg (ref 26.0–34.0)
MCHC: 33.4 g/dL (ref 30.0–36.0)
MCV: 86.1 fL (ref 80.0–100.0)
Platelets: 228 10*3/uL (ref 150–400)
RBC: 5.04 MIL/uL (ref 4.22–5.81)
RDW: 14.5 % (ref 11.5–15.5)
WBC: 8.3 10*3/uL (ref 4.0–10.5)
nRBC: 0 % (ref 0.0–0.2)

## 2020-02-15 MED ORDER — BENZONATATE 100 MG PO CAPS: 100.0000 mg | ORAL_CAPSULE | Freq: Three times a day (TID) | ORAL | 0 refills | Status: DC

## 2020-02-15 NOTE — ED Triage Notes (Signed)
Pt here from home with c/o chest pain epigastric area non radiating , no n/v or sob

## 2020-02-15 NOTE — ED Notes (Signed)
Pt speaks french

## 2020-02-15 NOTE — ED Provider Notes (Signed)
[facility] EMERGENCY DEPARTMENT Provider Note   CSN: [id number] Arrival date & time: 02/15/20  1221     History No chief complaint on file.   Coty Larsh is a 59 y.o. male.  59 year old male presents with recurring chest discomfort for over a month.  Patient is [place] speaking and interpreter was used.  Patient states that he has been seen by his doctor for similar symptoms and he does follow-up in family practice clinic and those notes were reviewed.  Patient states that symptoms can last from 5 minutes to 20 minutes and seem to wax and wane.  No exertional component to them.  Sometimes are made better after drinking water.  Sometimes made better with lying supine.  He denies any abdominal discomfort.  No vomiting or diarrhea noted.  Denies any fever, cough, congestion.  No leg pain or swelling.  Has a follow-up appointment with his doctor scheduled in about 10 days.        Past Medical History:  Diagnosis Date  . Acid reflux   . Chronic kidney disease    stage III  . Constipation    on miralax daily   . Hyperlipidemia   . Hypertension     Patient Active Problem List   Diagnosis Date Noted  . Costochondritis 01/26/2020  . Dry eye syndrome of both eyes 12/18/2019  . Mixed hyperlipidemia 11/28/2019  . Stage 3a chronic kidney disease 11/28/2019  . Essential hypertension 11/21/2019  . History of syphilis 11/21/2019  . Gastroesophageal reflux disease 11/21/2019  . Chronic idiopathic constipation 11/21/2019  . Chronic bilateral low back pain without sciatica 11/21/2019  . Refugee health examination 11/19/2019    Past Surgical History:  Procedure Laterality Date  . NO PAST SURGERIES         Family History  Problem Relation Age of Onset  . Colon cancer Neg Hx   . Colon polyps Neg Hx   . Esophageal cancer Neg Hx   . Rectal cancer Neg Hx   . Stomach cancer Neg Hx     Social History   Tobacco Use  . Smoking status: Never Smoker  .  Smokeless tobacco: Never Used  Substance Use Topics  . Alcohol use: Never  . Drug use: Never    Home Medications Prior to Admission medications   Medication Sig Start Date End Date Taking? Authorizing Provider  acetaminophen (TYLENOL 8 HOUR) 650 MG CR tablet Take 1 tablet (650 mg total) by mouth every 8 (eight) hours as needed for pain. 11/21/19   Meccariello, Bernita Raisin, DO  amLODipine (NORVASC) 10 MG tablet Take 1 tablet (10 mg total) by mouth at bedtime. 11/28/19   Meccariello, Bernita Raisin, DO  atorvastatin (LIPITOR) 20 MG tablet Take 1 tablet (20 mg total) by mouth daily. 11/28/19   Meccariello, Bernita Raisin, DO  calcium carbonate (TUMS) 500 MG chewable tablet Chew 1 tablet (200 mg of elemental calcium total) by mouth daily. 11/21/19   Meccariello, Bernita Raisin, DO  cycloSPORINE (RESTASIS) 0.05 % ophthalmic emulsion Place 1 drop into both eyes 2 (two) times daily. 12/18/19   Meccariello, Bernita Raisin, DO  lisinopril-hydrochlorothiazide (ZESTORETIC) 20-25 MG tablet Take 1 tablet by mouth daily. 01/25/20   Meccariello, Bernita Raisin, DO    Allergies    Patient has no allergy information on record.  Review of Systems   Review of Systems  All other systems reviewed and are negative.   Physical Exam Updated Vital Signs BP (!) 186/101   Pulse  98   Temp 98.6 F (37 C) (Oral)   Resp 17   SpO2 100%   Physical Exam Vitals and nursing note reviewed.  Constitutional:      General: He is not in acute distress.    Appearance: Normal appearance. He is well-developed. He is not toxic-appearing.  HENT:     Head: Normocephalic and atraumatic.  Eyes:     General: Lids are normal.     Conjunctiva/sclera: Conjunctivae normal.     Pupils: Pupils are equal, round, and reactive to light.  Neck:     Thyroid: No thyroid mass.     Trachea: No tracheal deviation.  Cardiovascular:     Rate and Rhythm: Normal rate and regular rhythm.     Heart sounds: Normal heart sounds. No murmur heard.  No gallop.   Pulmonary:      Effort: Pulmonary effort is normal. No respiratory distress.     Breath sounds: Normal breath sounds. No stridor. No decreased breath sounds, wheezing, rhonchi or rales.  Abdominal:     General: Bowel sounds are normal. There is no distension.     Palpations: Abdomen is soft.     Tenderness: There is no abdominal tenderness. There is no rebound.  Musculoskeletal:        General: No tenderness. Normal range of motion.     Cervical back: Normal range of motion and neck supple.  Skin:    General: Skin is warm and dry.     Findings: No abrasion or rash.  Neurological:     Mental Status: He is alert and oriented to person, place, and time.     GCS: GCS eye subscore is 4. GCS verbal subscore is 5. GCS motor subscore is 6.     Cranial Nerves: No cranial nerve deficit.     Sensory: No sensory deficit.  Psychiatric:        Speech: Speech normal.        Behavior: Behavior normal.     ED Results / Procedures / Treatments   Labs (all labs ordered are listed, but only abnormal results are displayed) Labs Reviewed  BASIC METABOLIC PANEL - Abnormal; Notable for the following components:      Result Value   Glucose, Bld 152 (*)    Creatinine, Ser 1.43 (*)    Calcium 10.5 (*)    GFR calc non Af Amer 53 (*)    All other components within normal limits  CBC  TROPONIN I (HIGH SENSITIVITY)  TROPONIN I (HIGH SENSITIVITY)    EKG EKG Interpretation  Date/Time:  Thursday February 15 2020 12:48:12 EDT Ventricular Rate:  108 PR Interval:  152 QRS Duration: 82 QT Interval:  340 QTC Calculation: 455 R Axis:   86 Text Interpretation: Sinus tachycardia ST & T wave abnormality, consider inferolateral ischemia Abnormal ECG No old tracing to compare Confirmed by Lacretia Leigh ([id number]) on 02/15/2020 6:33:02 PM   Radiology DG Chest 2 View  Result Date: 02/15/2020 CLINICAL DATA:  Chest pain, cough for 1 month EXAM: CHEST - 2 VIEW COMPARISON:  None. FINDINGS: The heart size and mediastinal contours  are within normal limits. No focal airspace consolidation, pleural effusion, or pneumothorax. The visualized skeletal structures are unremarkable. IMPRESSION: No active cardiopulmonary disease. Electronically Signed   By: Davina Poke D.O.   On: 02/15/2020 13:44    Procedures Procedures (including critical care time)  Medications Ordered in ED Medications - No data to display  ED Course  I have reviewed the  triage vital signs and the nursing notes.  Pertinent labs & imaging results that were available during my care of the patient were reviewed by me and considered in my medical decision making (see chart for details).    MDM Rules/Calculators/A&P                          Patient is delta troponin here negative.  Patient states that his symptoms actually are worse with coughing.  Will prescribe antitussive and he will need to follow-up with his doctor.  Low suspicion for ACS or PE Final Clinical Impression(s) / ED Diagnoses Final diagnoses:  None    Rx / DC Orders ED Discharge Orders    None       Lacretia Leigh, MD 02/15/20 2051

## 2020-02-28 ENCOUNTER — Ambulatory Visit (INDEPENDENT_AMBULATORY_CARE_PROVIDER_SITE_OTHER): Payer: Medicaid Other | Admitting: Family Medicine

## 2020-02-28 ENCOUNTER — Encounter: Payer: Self-pay | Admitting: Family Medicine

## 2020-02-28 ENCOUNTER — Other Ambulatory Visit: Payer: Self-pay

## 2020-02-28 VITALS — BP 122/68 | HR 114 | Ht 65.0 in | Wt 185.6 lb

## 2020-02-28 DIAGNOSIS — G629 Polyneuropathy, unspecified: Secondary | ICD-10-CM | POA: Diagnosis not present

## 2020-02-28 DIAGNOSIS — I10 Essential (primary) hypertension: Secondary | ICD-10-CM

## 2020-02-28 DIAGNOSIS — K219 Gastro-esophageal reflux disease without esophagitis: Secondary | ICD-10-CM | POA: Diagnosis not present

## 2020-02-28 DIAGNOSIS — R0789 Other chest pain: Secondary | ICD-10-CM | POA: Diagnosis present

## 2020-02-28 DIAGNOSIS — E782 Mixed hyperlipidemia: Secondary | ICD-10-CM

## 2020-02-28 DIAGNOSIS — N529 Male erectile dysfunction, unspecified: Secondary | ICD-10-CM | POA: Diagnosis not present

## 2020-02-28 MED ORDER — SILDENAFIL CITRATE 50 MG PO TABS: 50.0000 mg | ORAL_TABLET | Freq: Every day | ORAL | 0 refills | Status: DC | PRN

## 2020-02-28 MED ORDER — FAMOTIDINE 40 MG PO TABS: 40.0000 mg | ORAL_TABLET | Freq: Every day | ORAL | 3 refills | Status: DC

## 2020-02-28 NOTE — Assessment & Plan Note (Signed)
Previous EKG was negative for acute changes and troponins were negative.  He is not currently having chest pain.  Does have tenderness palpation of costochondral junction, therefore could consider costochondritis.  Could also consider reflux as he has a history of reflux-like symptoms and cough, pain worsens at night when he lies flat.  We will go ahead and treat him for reflux by starting Pepcid 40 mg daily and see if this helps with improvement.  Would hold off on a PPI at this time to perform urea breath test hopefully at next visit.  Advised to return to care if pain worsens or changes in nature especially if it is associated with shortness of breath.  Discussed with Dr. Erin Hearing, he agrees that cardiology referral is not indicated at this time as it is very unlikely that he has had a heart attack and he is currently on treatment for hypertension and hyperlipidemia.  Hypertension is well controlled on his current regimen.

## 2020-02-28 NOTE — Progress Notes (Signed)
SUBJECTIVE:   CHIEF COMPLAINT / HPI:   Atypical chest pain Patient was seen on 9/30 in ED for chest pain EKG without acute ST changes Delta troponins were negative At the time patient's pain was worse with coughing He states that the pain was in epigastric region  Has come and gone, sometimes worse with coughing He was having reflux with this Not currently having chest pain Seems to have the pain worse at night when he is lying down He was given tessalon perles for his cough, but states it didn't help Neither of his parents have known heart problems   HTN Current regimen: Lisinopril-hydrochlorothiazide, Norvasc 10 mg At last visit patient had run out of lisinopril-hydrochlorothiazide 1 week prior to his visit Checks BP at home every morning 130/80, this AM was 126/77 He has been taking his medications daily  Hyperlipidemia Current regimen: Lipitor 20 mg daily Last lipid panel on 7/6 with LDL of 142 Chest pain per above Has been taking lipitor  Numbness and tingling Bilateral hands, worse in bilateral thumbs Thumbs have been worse for the last few months, but has been occurring over 1 year Denies neck pain, tingling in arms Feels that occasionally his hands get weak Does not do a lot of typing Constant tingling Has never seen a doctor for this Has not tried any medications No numbness and tingling in his feet Sometimes massage helps the feeling   Erectile dysfunction Patient states that he has difficulty initiating and maintaining interaction Has been going on for quite some time, has difficulty quantifying Is wondering if he can have some medication to help with this   PERTINENT  PMH / PSH: HLD, HTN, acid reflux, history of previously treated syphilis, hemorrhoids and constipation, chronic back pain  OBJECTIVE:   BP 122/68   Pulse (!) 114   Ht 5\' 5"  (1.651 m)   Wt 185 lb 9.6 oz (84.2 kg)   SpO2 98%   BMI 30.89 kg/m    Physical Exam:  General: 59 y.o.  male in NAD Cardio: RRR no m/r/g, HR less than 100 on examination, mild tenderness to palpation of costochondral junction at ribs 7 and 8, 2+ radial pulses bilaterally Lungs: CTAB, no wheezing, no rhonchi, no crackles, no IWOB on RA Skin: warm and dry Extremities: No edema, negative Tinel's of bilateral carpal tunnel, negative Phalen's bilaterally, 5/5 strength BUE Neck: Full ROM, no TTP spinous processes in cervical spine, no step-off, negative Spurling bilaterally   ASSESSMENT/PLAN:   Atypical chest pain Previous EKG was negative for acute changes and troponins were negative.  He is not currently having chest pain.  Does have tenderness palpation of costochondral junction, therefore could consider costochondritis.  Could also consider reflux as he has a history of reflux-like symptoms and cough, pain worsens at night when he lies flat.  We will go ahead and treat him for reflux by starting Pepcid 40 mg daily and see if this helps with improvement.  Would hold off on a PPI at this time to perform urea breath test hopefully at next visit.  Advised to return to care if pain worsens or changes in nature especially if it is associated with shortness of breath.  Discussed with Dr. Erin Hearing, he agrees that cardiology referral is not indicated at this time as it is very unlikely that he has had a heart attack and he is currently on treatment for hypertension and hyperlipidemia.  Hypertension is well controlled on his current regimen.  Essential hypertension  BP is well controlled today.  He is on lisinopril-hydrochlorothiazide and Norvasc 10 mg.  We will continue with this.  We will go ahead and recheck a BMP today.  Mixed hyperlipidemia He is currently on Lipitor 20 mg daily, last LDL was 142, placed on statin due to ASCVD risk.  He has been having chest pain as listed above.  We will recheck a direct LDL today to assess for decrease.  Neuropathy Neurovascularly intact distally.  No sign of carpal  tunnel on examination.  No evidence of nerve impingement in cervical spine.  Distribution would also not be consistent with nerve compression at cervical level given distribution only in hands.  He has had a normal TSH previously, will go ahead and obtain a vitamin B12 today to assess for cause of neuropathy.  He does not have diabetes.  Could consider gabapentin in the future if this bothers him.  Erectile dysfunction Hypertension is well controlled.  He is being treated for hyperlipidemia.  He does not appear to have unstable coronary artery disease despite chest pain.  Discussed with Dr. Erin Hearing, he agrees that it would be safe to give patient a trial of Viagra.  Advised to use as needed to obtain and maintain erection.     Cleophas Dunker, [place]

## 2020-02-28 NOTE — Assessment & Plan Note (Signed)
Hypertension is well controlled.  He is being treated for hyperlipidemia.  He does not appear to have unstable coronary artery disease despite chest pain.  Discussed with Dr. Erin Hearing, he agrees that it would be safe to give patient a trial of Viagra.  Advised to use as needed to obtain and maintain erection.

## 2020-02-28 NOTE — Patient Instructions (Signed)
Thank you for coming to see me today. It was a pleasure. Today we talked about:   Continue to take your blood pressure medication.  Your blood pressure looks great today.  We will recheck your cholesterol today and vitamin B12 to check on your tingling.  Please follow-up with me in 1 month.  If you have any questions or concerns, please do not hesitate to call the office at [phone].  Best,   Arizona Constable, DO

## 2020-02-28 NOTE — Assessment & Plan Note (Signed)
BP is well controlled today.  He is on lisinopril-hydrochlorothiazide and Norvasc 10 mg.  We will continue with this.  We will go ahead and recheck a BMP today.

## 2020-02-28 NOTE — Assessment & Plan Note (Signed)
He is currently on Lipitor 20 mg daily, last LDL was 142, placed on statin due to ASCVD risk.  He has been having chest pain as listed above.  We will recheck a direct LDL today to assess for decrease.

## 2020-02-28 NOTE — Assessment & Plan Note (Signed)
Neurovascularly intact distally.  No sign of carpal tunnel on examination.  No evidence of nerve impingement in cervical spine.  Distribution would also not be consistent with nerve compression at cervical level given distribution only in hands.  He has had a normal TSH previously, will go ahead and obtain a vitamin B12 today to assess for cause of neuropathy.  He does not have diabetes.  Could consider gabapentin in the future if this bothers him.

## 2020-02-29 LAB — BASIC METABOLIC PANEL
BUN/Creatinine Ratio: 11 (ref 9–20)
BUN: 15 mg/dL (ref 6–24)
CO2: 24 mmol/L (ref 20–29)
Calcium: 10.4 mg/dL — ABNORMAL HIGH (ref 8.7–10.2)
Chloride: 100 mmol/L (ref 96–106)
Creatinine, Ser: 1.41 mg/dL — ABNORMAL HIGH (ref 0.76–1.27)
GFR calc Af Amer: 63 mL/min/{1.73_m2} (ref >59)
GFR calc non Af Amer: 54 mL/min/{1.73_m2} — ABNORMAL LOW (ref >59)
Glucose: 118 mg/dL — ABNORMAL HIGH (ref 65–99)
Potassium: 3.8 mmol/L (ref 3.5–5.2)
Sodium: 144 mmol/L (ref 134–144)

## 2020-02-29 LAB — LDL CHOLESTEROL, DIRECT: LDL Direct: 100 mg/dL — ABNORMAL HIGH (ref 0–99)

## 2020-02-29 LAB — VITAMIN B12: Vitamin B-12: 993 pg/mL (ref 232–1245)

## 2020-03-22 ENCOUNTER — Ambulatory Visit (INDEPENDENT_AMBULATORY_CARE_PROVIDER_SITE_OTHER): Payer: Medicaid Other | Admitting: Student in an Organized Health Care Education/Training Program

## 2020-03-22 VITALS — BP 142/85 | HR 87

## 2020-03-22 DIAGNOSIS — R059 Cough, unspecified: Secondary | ICD-10-CM | POA: Diagnosis not present

## 2020-03-22 DIAGNOSIS — G629 Polyneuropathy, unspecified: Secondary | ICD-10-CM | POA: Diagnosis not present

## 2020-03-22 DIAGNOSIS — R053 Chronic cough: Secondary | ICD-10-CM

## 2020-03-22 DIAGNOSIS — K219 Gastro-esophageal reflux disease without esophagitis: Secondary | ICD-10-CM

## 2020-03-22 MED ORDER — GABAPENTIN 300 MG PO CAPS: 300.0000 mg | ORAL_CAPSULE | Freq: Every day | ORAL | 0 refills | Status: DC

## 2020-03-22 MED ORDER — OMEPRAZOLE 20 MG PO CPDR: 20.0000 mg | DELAYED_RELEASE_CAPSULE | Freq: Two times a day (BID) | ORAL | 3 refills | Status: DC

## 2020-03-22 MED ORDER — CETIRIZINE HCL 10 MG PO TABS: 10.0000 mg | ORAL_TABLET | Freq: Every day | ORAL | 1 refills | Status: DC

## 2020-03-22 NOTE — Progress Notes (Signed)
° °  SUBJECTIVE:   CHIEF COMPLAINT / HPI: cough  [place] interpretor used for this entire encounter.   Cough- 3 months. Intermittent severity. Sometimes cause chest and stomach discomfort while coughing. Has extensive negative cardiac r/o. This is unchanged from previous visits for this same concern.  Non-productive. Mucous has some traces of blood in it. Has stomach pains.  Denies fever, blood clots in sputum, post-nasal drip. Worse at night.  Has begun workup/treatment for GERD.  OBJECTIVE:   BP (!) 142/85    Pulse 87    SpO2 100%   General: NAD, pleasant, able to participate in exam Cardiac: RRR, normal heart sounds, no murmurs. 2+ radial and PT pulses bilaterally Respiratory: CTAB, normal effort, No wheezes, rales or rhonchi. Negative for chest wall discomfort with deep breaths Abdomen: soft, nontender, nondistended, no hepatic or splenomegaly, +BS. Positive for tenderness of palpation to xyphoid.  Extremities: no edema. WWP. Skin: warm and dry, no rashes noted Neuro: alert and oriented, no focal deficits Psych: Normal affect and mood  ASSESSMENT/PLAN:   Neuropathy Gabapentin 300mg  nightly Follow up with PCP if not resolved. Consider nerve studies or cervical spine imaging.  Chronic cough Chronic cough which is unchanged from previous multiple visits.  PCP initiated GERD treatment with H2 blocker. In line with her previous recommendations for further workup. - reviewed medications as contributing - normal chest/lung exam - urea breath test today for h.pylori r/o - starting PPI - starting zyrtec to also treat possible allergies     Richarda Osmond, [place]

## 2020-03-22 NOTE — Patient Instructions (Addendum)
It was a pleasure to see you today!  To summarize our discussion for this visit:  Your cough does not sound infectious.   Continue to treat acid reflux with a new medication called omeprazole.   I will also add on an allergy medication to see if this will help.   If these are not improving your symptoms, please see your PCP to consider a referral to GI and/or pulmonoolgy.  I can start a nerve pain medication to take at night to help with your hand tingling.     Votre toux ne semble pas contagieuse.  Continuez  traiter le reflux acide avec un nouveau mdicament appel omprazole.  Je vais galement ajouter un mdicament contre les allergies pour voir si Contractor.  Si ceux-ci n'amliorent pas vos symptmes, veuillez consulter votre PCP pour envisager une orientation vers un service de Air cabin crew et/ou de pneumologie.  Je peux commencer un mdicament contre la douleur nerveuse  prendre la nuit pour aider  soulager les [employer] mains.  Some additional health maintenance measures we should update are: Health Maintenance Due  Topic Date Due  . COVID-19 Vaccine (1) Never done  . INFLUENZA VACCINE  Never done  .    Call the clinic at [phone] if your symptoms worsen or you have any concerns.   Thank you for allowing me to take part in your care,  Dr. Doristine Mango

## 2020-03-22 NOTE — Assessment & Plan Note (Signed)
Gabapentin 300mg  nightly Follow up with PCP if not resolved. Consider nerve studies or cervical spine imaging.

## 2020-03-24 DIAGNOSIS — R053 Chronic cough: Secondary | ICD-10-CM | POA: Insufficient documentation

## 2020-03-24 LAB — H. PYLORI BREATH TEST: H pylori Breath Test: NEGATIVE

## 2020-03-24 NOTE — Assessment & Plan Note (Signed)
Chronic cough which is unchanged from previous multiple visits.  PCP initiated GERD treatment with H2 blocker. In line with her previous recommendations for further workup. - reviewed medications as contributing - normal chest/lung exam - urea breath test today for h.pylori r/o - starting PPI - starting zyrtec to also treat possible allergies

## 2020-03-25 ENCOUNTER — Encounter: Payer: Self-pay | Admitting: Student in an Organized Health Care Education/Training Program

## 2020-04-03 NOTE — Progress Notes (Signed)
    SUBJECTIVE:   CHIEF COMPLAINT / HPI:   Cough/GERD Patient had a negative urea breath test for H. pylori Currently on Pepcid and Prilosec Patient had a colonoscopy on 02/01/2020 with Dr. Henrene Pastor who found 2 polyps Pathology showed tubular adenoma in both fragments, no high-grade dysplasia or malignancy identified He was advised to have a repeat colonoscopy in 7 years Reports cough, mostly at night, worse when he is lying flat He states that since seeing Dr. Ouida Sills he hasn't had a change in his symptoms Never smoker Doesn't have reflux symptoms he has noticed or stomach pain after eating No change in cough with what he eats He has noticed the cough for about 3 months No shortness of breath or productive cough  Bilateral hand neuropathy He was started on gabapentin on 11/5 by another physician at 300 mg nightly Has been taking it nightly, but it as not helped at all It feels as though his fingers curve and get stiff or "blocked" Notes tingling in both hands as well Tingling is the worse part of pain, just in the fingers 2nd and 4th fingers are involved Thumbs are the worst Has been occurring almost and year and getting worse Takes tylenol and it doesn't really help  Vaccinations Needs Hep A and Flu today  PERTINENT  PMH / PSH: HLD, HTN, acid reflux, history of previously treated syphilis, hemorrhoids and constipation, chronic back pain  OBJECTIVE:   BP 120/78   Pulse 89   Ht 5\' 5"  (1.651 m)   Wt 184 lb (83.5 kg)   SpO2 99%   BMI 30.62 kg/m    Physical Exam:  General: 59 y.o. 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  No other obvious deformities.  He has pain with grinding of PIP and MCP of bilateral thumbs.  Positive Tinel's at carpal tunnel bilaterally.  Negative reverse Phalen's.  5/5 grip strength.   ASSESSMENT/PLAN:   ACE-inhibitor  cough Patient has been treated for GERD appropriately, negative for H. pylori.  He is on a PPI and H2 blocker.  We will go ahead and continue this.  Given that he is no longer having reflux-like symptoms per his report, but continues to have a cough at night, will see if ACE is causing cough.  Will discontinue lisinopril-hydrochlorothiazide.  Will start hydrochlorothiazide separately at 25 mg.  We will also start losartan 25 mg.  Will obtain a BMP at next visit.  We will also check blood pressure at next visit in 1 month.  Essential hypertension BP medications changed per above.  Follow-up in 1 month.  Neuropathy Unclear cause of his symptoms, could be carpal tunnel given positive Tinel's at wrist.  He also has some arthritis which could be contributing to his pain.  We will go ahead and treat for carpal tunnel, obtained bilateral wrist splints for night splinting from [facility].  He was given these in the office and advised to use them at night only.  Can also continue Tylenol as needed for his pain.  Follow-up in 1 month.   Patient will follow up in 1 month.  Plan to discuss elevated calcium at next visit.  Cleophas Dunker, [place]

## 2020-04-04 ENCOUNTER — Other Ambulatory Visit: Payer: Self-pay

## 2020-04-04 ENCOUNTER — Ambulatory Visit: Payer: Medicaid Other | Admitting: Family Medicine

## 2020-04-04 ENCOUNTER — Encounter: Payer: Self-pay | Admitting: Family Medicine

## 2020-04-04 VITALS — BP 120/78 | HR 89 | Ht 65.0 in | Wt 184.0 lb

## 2020-04-04 DIAGNOSIS — Z23 Encounter for immunization: Secondary | ICD-10-CM

## 2020-04-04 DIAGNOSIS — R058 Other specified cough: Secondary | ICD-10-CM

## 2020-04-04 DIAGNOSIS — I10 Essential (primary) hypertension: Secondary | ICD-10-CM

## 2020-04-04 DIAGNOSIS — G629 Polyneuropathy, unspecified: Secondary | ICD-10-CM | POA: Diagnosis not present

## 2020-04-04 DIAGNOSIS — T464X5A Adverse effect of angiotensin-converting-enzyme inhibitors, initial encounter: Secondary | ICD-10-CM | POA: Insufficient documentation

## 2020-04-04 MED ORDER — HYDROCHLOROTHIAZIDE 25 MG PO TABS: 25.0000 mg | ORAL_TABLET | Freq: Every day | ORAL | 3 refills | Status: DC

## 2020-04-04 MED ORDER — LOSARTAN POTASSIUM 25 MG PO TABS: 25.0000 mg | ORAL_TABLET | Freq: Every day | ORAL | 3 refills | Status: DC

## 2020-04-04 NOTE — Assessment & Plan Note (Signed)
Patient has been treated for GERD appropriately, negative for H. pylori.  He is on a PPI and H2 blocker.  We will go ahead and continue this.  Given that he is no longer having reflux-like symptoms per his report, but continues to have a cough at night, will see if ACE is causing cough.  Will discontinue lisinopril-hydrochlorothiazide.  Will start hydrochlorothiazide separately at 25 mg.  We will also start losartan 25 mg.  Will obtain a BMP at next visit.  We will also check blood pressure at next visit in 1 month.

## 2020-04-04 NOTE — Patient Instructions (Addendum)
Thank you for coming to see me today. It was a pleasure. Today we talked about:   You should stop taking lisinopril-hydrochlorothiazide. Start losartan and hydrochlorothiazide separately.  Use the splints at night.    Come back in one month.  If you have any questions or concerns, please do not hesitate to call the office at [phone].  Best,   [place] Constable, DO

## 2020-04-04 NOTE — Assessment & Plan Note (Signed)
Unclear cause of his symptoms, could be carpal tunnel given positive Tinel's at wrist.  He also has some arthritis which could be contributing to his pain.  We will go ahead and treat for carpal tunnel, obtained bilateral wrist splints for night splinting from [facility].  He was given these in the office and advised to use them at night only.  Can also continue Tylenol as needed for his pain.  Follow-up in 1 month.

## 2020-04-04 NOTE — Assessment & Plan Note (Signed)
BP medications changed per above.  Follow-up in 1 month.

## 2020-04-23 NOTE — Progress Notes (Signed)
    SUBJECTIVE:   CHIEF COMPLAINT / HPI:   Hand pain: Patient has a 1-year history of progressively worsening bilateral hand pain that is further exacerbated by his job at work. 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 Patient describes pain in his bilateral thumbs, second and third fingers, describes tingling in the fingers is the worst part of his pain. He reports that he feels as though his fingers curving get stiff or "blocked" and are unable to straighten out the patient was prescribed gabapentin on 11/5 but reports this has not been helpful for him. He has also been taking Tylenol but states this is not helpful at alleviating his pain. The patient is asking for medical support and documentation so that he can change his job position at work.   Blood Pressure: At previous appointment on 04/04/2020 patient was evaluated for cough.  His lisinopril-HCTZ was discontinued, was started on HCTZ 25 mg and losartan 25 mg.  Patient asked to follow-up for blood pressure in about 1 month with BMP draw.  Today blood pressure is 150/86, the patient however reports he did not take his medicine yet today.   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  No other obvious deformities.  He has pain with grinding of PIP and MCP of bilateral thumbs.  Positive Tinel's at carpal tunnel bilaterally.  Negative reverse Phalen's.  5/5 grip strength.  ASSESSMENT/PLAN:   Essential hypertension BP 150/86 today, patient reports he has not yet taken his blood pressure  medications. -Will follow-up 12/13 for blood pressure  Bilateral hand pain He has pain with grinding of PIP and MCP of bilateral thumbs.  Positive Tinel's at carpal tunnel bilaterally.  Negative reverse Phalen's.  5/5 grip strength bilaterally. Patient reporting tingling sensation in bilateral thumbs, second and third fingers. -Most concerned for carpal tunnel syndrome, patient has not been wearing his wrist splints at night, I encourage compliance with this -Patient may benefit from referral to hand specialist if symptoms continue/worsen versus EMG -Can continue Tylenol/gabapentin as helpful -Patient given documentation for work and supportive his change of position to decrease his hand pain     Daisy Floro, [place]

## 2020-04-24 ENCOUNTER — Ambulatory Visit (INDEPENDENT_AMBULATORY_CARE_PROVIDER_SITE_OTHER): Payer: Medicaid Other | Admitting: Family Medicine

## 2020-04-24 ENCOUNTER — Other Ambulatory Visit: Payer: Self-pay

## 2020-04-24 VITALS — BP 150/86 | HR 88 | Ht 65.0 in | Wt 186.0 lb

## 2020-04-24 DIAGNOSIS — M79642 Pain in left hand: Secondary | ICD-10-CM | POA: Diagnosis not present

## 2020-04-24 DIAGNOSIS — M79641 Pain in right hand: Secondary | ICD-10-CM | POA: Diagnosis not present

## 2020-04-24 DIAGNOSIS — I10 Essential (primary) hypertension: Secondary | ICD-10-CM | POA: Diagnosis not present

## 2020-04-24 DIAGNOSIS — Z8619 Personal history of other infectious and parasitic diseases: Secondary | ICD-10-CM

## 2020-04-24 NOTE — Patient Instructions (Signed)
Thank you for coming in to see [place] today! Please see below to review our plan for today's visit:  1. Wear your braces as night on your wrists. 2. I have provided you with a letter to help with work restrictions. 3. Take your blood pressure medicines every day 4. Please follow up with [place] in 4-6 weeks.   Please call the clinic at [phone] if your symptoms worsen or you have any concerns. It was our pleasure to serve you!   Dr. Milus Banister [facility]

## 2020-04-29 ENCOUNTER — Ambulatory Visit: Payer: Medicaid Other | Admitting: Family Medicine

## 2020-04-29 ENCOUNTER — Other Ambulatory Visit: Payer: Self-pay

## 2020-04-29 VITALS — BP 130/70 | HR 96 | Ht 65.0 in | Wt 188.0 lb

## 2020-04-29 DIAGNOSIS — G5603 Carpal tunnel syndrome, bilateral upper limbs: Secondary | ICD-10-CM

## 2020-04-29 DIAGNOSIS — I10 Essential (primary) hypertension: Secondary | ICD-10-CM

## 2020-04-29 DIAGNOSIS — G5600 Carpal tunnel syndrome, unspecified upper limb: Secondary | ICD-10-CM | POA: Insufficient documentation

## 2020-04-29 DIAGNOSIS — Z23 Encounter for immunization: Secondary | ICD-10-CM

## 2020-04-29 NOTE — Assessment & Plan Note (Signed)
Well-controlled today. -Follow-up BMP -Continue losartan and HCTZ

## 2020-04-29 NOTE — Progress Notes (Signed)
    SUBJECTIVE:   CHIEF COMPLAINT / HPI:   Hand pain His hand discomfort is primarily a sensation of numbness in his first second and third finger of each hand.  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  He was previously prescribed wrist splints to use at night.  He used them for a short period but stopped because he did not find them effective after short time and noticed that they did not cover his whole hand so he did not understand how they can be effective in treating his fingers.  BP He has been taking hydrochlorothiazide and losartan as prescribed.  PERTINENT  PMH / PSH: CKD stage III, hypertension  OBJECTIVE:   BP 130/70   Pulse 96   Ht 5\' 5"  (1.651 m)   Wt 188 lb (85.3 kg)   SpO2 99%   BMI 31.28 kg/m    General: Alert and cooperative and appears to be in no acute distress Respiratory: Breathing comfortably on room air.  No respiratory distress. Extremities: No peripheral edema. Warm/ well perfused.  Reproducible discomfort and numbness of fingers 1, 2 and 3 on both right and left hand with compression of the median nerve at the carpal tunnel. Neuro: Cranial nerves grossly intact    ASSESSMENT/PLAN:   Carpal tunnel syndrome I agree with his previous assessments and diagnosis of carpal tunnel syndrome.  We discussed the importance of nighttime wrist splints and how they work.  He was encouraged to wear them every night for at least 6 weeks in order to see improvement.  NSAIDs were avoided due to his history of CKD 3. -Follow-up with PCP in 1 month to assess progress -Consider prednisone taper or local injections if symptoms persist  Essential hypertension Well-controlled today. -Follow-up BMP -Continue losartan and HCTZ     Matilde Haymaker, MD [place]

## 2020-04-29 NOTE — Assessment & Plan Note (Signed)
I agree with his previous assessments and diagnosis of carpal tunnel syndrome.  We discussed the importance of nighttime wrist splints and how they work.  He was encouraged to wear them every night for at least 6 weeks in order to see improvement.  NSAIDs were avoided due to his history of CKD 3. -Follow-up with PCP in 1 month to assess progress -Consider prednisone taper or local injections if symptoms persist

## 2020-04-29 NOTE — Patient Instructions (Addendum)
Carpel tunnel syndrome: It sounds like the discomfort you are having in your hands is from carpal tunnel syndrome.  It is very important that you wear your wrist splints every night for at least 6 weeks.  You can also take your gabapentin if you are having discomfort.  I have written a work note for you and scheduled an appointment with your primary doctor on January 19.  Blood pressure: Your blood pressure looks good today.  We will get some lab work to make sure your kidney function is appropriate.  I will let you know if there is anything abnormal.  Syndrome du canal carpien : On dirait que l'inconfort que vous ressentez dans vos mains provient du syndrome du canal carpien. Il est trs important que vous portiez vos attelles de poignet tous les soirs pendant au moins 6 semaines. Vous pouvez galement prendre votre gabapentine si vous ressentez une gne. J'ai rdig une note de travail pour vous et pris rendez-vous avec votre mdecin traitant le 19 janvier.  Tension artrielle : votre tension artrielle semble bonne aujourd'hui. Nous effectuerons des travaux de laboratoire pour nous assurer que votre fonction rnale est approprie. Je vous ferai savoir s'il y a quelque chose d'anormal.

## 2020-04-30 DIAGNOSIS — M79641 Pain in right hand: Secondary | ICD-10-CM | POA: Insufficient documentation

## 2020-04-30 LAB — BASIC METABOLIC PANEL
BUN/Creatinine Ratio: 15 (ref 9–20)
BUN: 15 mg/dL (ref 6–24)
CO2: 28 mmol/L (ref 20–29)
Calcium: 10.3 mg/dL — ABNORMAL HIGH (ref 8.7–10.2)
Chloride: 100 mmol/L (ref 96–106)
Creatinine, Ser: 1.02 mg/dL (ref 0.76–1.27)
GFR calc Af Amer: 93 mL/min/{1.73_m2} (ref >59)
GFR calc non Af Amer: 80 mL/min/{1.73_m2} (ref >59)
Glucose: 112 mg/dL — ABNORMAL HIGH (ref 65–99)
Potassium: 3.4 mmol/L — ABNORMAL LOW (ref 3.5–5.2)
Sodium: 143 mmol/L (ref 134–144)

## 2020-04-30 NOTE — Assessment & Plan Note (Signed)
He has pain with grinding of PIP and MCP of bilateral thumbs.  Positive Tinel's at carpal tunnel bilaterally.  Negative reverse Phalen's.  5/5 grip strength bilaterally. Patient reporting tingling sensation in bilateral thumbs, second and third fingers. -Most concerned for carpal tunnel syndrome, patient has not been wearing his wrist splints at night, I encourage compliance with this -Patient may benefit from referral to hand specialist if symptoms continue/worsen versus EMG -Can continue Tylenol/gabapentin as helpful -Patient given documentation for work and supportive his change of position to decrease his hand pain

## 2020-04-30 NOTE — Assessment & Plan Note (Signed)
BP 150/86 today, patient reports he has not yet taken his blood pressure medications. -Will follow-up 12/13 for blood pressure

## 2020-05-17 ENCOUNTER — Encounter (HOSPITAL_COMMUNITY): Payer: Self-pay | Admitting: Emergency Medicine

## 2020-05-17 ENCOUNTER — Ambulatory Visit (HOSPITAL_COMMUNITY)
Admission: EM | Admit: 2020-05-17 | Discharge: 2020-05-17 | Disposition: A | Discharge status: Home / Self Care | Payer: Medicaid Other | Attending: Student | Admitting: Student

## 2020-05-17 DIAGNOSIS — M62830 Muscle spasm of back: Secondary | ICD-10-CM

## 2020-05-17 MED ORDER — METAXALONE 800 MG PO TABS: 800.0000 mg | ORAL_TABLET | Freq: Three times a day (TID) | ORAL | 0 refills | Status: DC

## 2020-05-17 NOTE — Discharge Instructions (Addendum)
-  For your back pain, take the muscle relaxer (Metaxalone) up to 3x daily. Try this at night as it could make you drowsy.  -Use heating pad on back -You can use tylenol/ibuprofen

## 2020-05-17 NOTE — ED Triage Notes (Addendum)
Pt presents with lower back pain xs 3 days. Denies any fall or injury. States lifts a lot at work. States unable to walk or bend without pain.

## 2020-05-17 NOTE — ED Provider Notes (Signed)
[facility]    CSN: [id number] Arrival date & time: 05/17/20  1244      History   Chief Complaint Chief Complaint  Patient presents with  . Back Pain    HPI David Owen is a 59 y.o. male presenting for lower back pain x3 days. History of GERD, CKD III, constipation, hyperlipidemia, hypertension. Translation services used for this visit. States he has an active job and lifts heavy things daily. For the last 3 days, his lower back on either side of his spine has been very painful with movement. Has been massaging the area with some relief. He notes pain at rest that is exacerbated with movement/bending/lifting. Denies pain shooting down legs, denies numbness in arms/legs, denies weakness in arms/legs, denies saddle anesthesia, denies bowel/bladder incontinence. Denies injury, trauma. Denies hematuria, dysuria, frequency, urgency, n/v/d/abd pain, fevers/chills, abdnormal discharge.     HPI  Past Medical History:  Diagnosis Date  . Acid reflux   . Chronic kidney disease    stage III  . Constipation    on miralax daily   . Hyperlipidemia   . Hypertension     Patient Active Problem List   Diagnosis Date Noted  . Bilateral hand pain 04/30/2020  . Carpal tunnel syndrome 04/29/2020  . ACE-inhibitor cough 04/04/2020  . Chronic cough 03/24/2020  . Erectile dysfunction 02/28/2020  . Dry eye syndrome of both eyes 12/18/2019  . Mixed hyperlipidemia 11/28/2019  . Stage 3a chronic kidney disease (HCC) 11/28/2019  . Essential hypertension 11/21/2019  . History of syphilis 11/21/2019  . Gastroesophageal reflux disease 11/21/2019  . Chronic idiopathic constipation 11/21/2019  . Chronic bilateral low back pain without sciatica 11/21/2019  . Refugee health examination 11/19/2019    Past Surgical History:  Procedure Laterality Date  . NO PAST SURGERIES         Home Medications    Prior to Admission medications   Medication Sig Start Date End Date Taking?  Authorizing Provider  metaxalone (SKELAXIN) 800 MG tablet Take 1 tablet (800 mg total) by mouth 3 (three) times daily. 05/17/20  Yes Rhys Martini, PA-C  acetaminophen (TYLENOL 8 HOUR) 650 MG CR tablet Take 1 tablet (650 mg total) by mouth every 8 (eight) hours as needed for pain. 11/21/19   Meccariello, Solmon Ice, DO  amLODipine (NORVASC) 10 MG tablet Take 1 tablet (10 mg total) by mouth at bedtime. 11/28/19   Meccariello, Solmon Ice, DO  atorvastatin (LIPITOR) 20 MG tablet Take 1 tablet (20 mg total) by mouth daily. 11/28/19   Meccariello, Solmon Ice, DO  benzonatate (TESSALON) 100 MG capsule Take 1 capsule (100 mg total) by mouth every 8 (eight) hours. 02/15/20   Lorre Nick, MD  calcium carbonate (TUMS) 500 MG chewable tablet Chew 1 tablet (200 mg of elemental calcium total) by mouth daily. 11/21/19   Meccariello, Solmon Ice, DO  cetirizine (ZYRTEC) 10 MG tablet Take 1 tablet (10 mg total) by mouth daily. 03/22/20   Anderson, Chelsey L, DO  cycloSPORINE (RESTASIS) 0.05 % ophthalmic emulsion Place 1 drop into both eyes 2 (two) times daily. 12/18/19   Meccariello, Solmon Ice, DO  famotidine (PEPCID) 40 MG tablet Take 1 tablet (40 mg total) by mouth daily. 02/28/20   Meccariello, Solmon Ice, DO  gabapentin (NEURONTIN) 300 MG capsule Take 1 capsule (300 mg total) by mouth at bedtime. 03/22/20   Anderson, Chelsey L, DO  hydrochlorothiazide (HYDRODIURIL) 25 MG tablet Take 1 tablet (25 mg total) by mouth daily. 04/04/20  Meccariello, Bernita Raisin, DO  losartan (COZAAR) 25 MG tablet Take 1 tablet (25 mg total) by mouth at bedtime. 04/04/20   Meccariello, Bernita Raisin, DO  omeprazole (PRILOSEC) 20 MG capsule Take 1 capsule (20 mg total) by mouth 2 (two) times daily before a meal. 03/22/20   Anderson, Chelsey L, DO  sildenafil (VIAGRA) 50 MG tablet Take 1 tablet (50 mg total) by mouth daily as needed for erectile dysfunction. 02/28/20   Meccariello, Bernita Raisin, DO    Family History Family History  Problem Relation Age of Onset   . Colon cancer Neg Hx   . Colon polyps Neg Hx   . Esophageal cancer Neg Hx   . Rectal cancer Neg Hx   . Stomach cancer Neg Hx     Social History Social History   Tobacco Use  . Smoking status: Never Smoker  . Smokeless tobacco: Never Used  Substance Use Topics  . Alcohol use: Never  . Drug use: Never     Allergies   Patient has no known allergies.   Review of Systems Review of Systems  Musculoskeletal: Positive for back pain.  All other systems reviewed and are negative.    Physical Exam Triage Vital Signs ED Triage Vitals  Enc Vitals Group     BP 05/17/20 1336 (!) 154/87     Pulse Rate 05/17/20 1336 85     Resp 05/17/20 1336 16     Temp 05/17/20 1336 97.9 F (36.6 C)     Temp Source 05/17/20 1336 Oral     SpO2 05/17/20 1336 100 %     Weight --      Height --      Head Circumference --      Peak Flow --      Pain Score 05/17/20 1334 9     Pain Loc --      Pain Edu? --      Excl. in [place]? --    No data found.  Updated Vital Signs BP (!) 154/87 (BP Location: Right Arm)   Pulse 85   Temp 97.9 F (36.6 C) (Oral)   Resp 16   SpO2 100%   Visual Acuity Right Eye Distance:   Left Eye Distance:   Bilateral Distance:    Right Eye Near:   Left Eye Near:    Bilateral Near:     Physical Exam Vitals reviewed.  Constitutional:      General: He is not in acute distress.    Appearance: Normal appearance. He is not ill-appearing.  HENT:     Head: Normocephalic and atraumatic.  Abdominal:     Comments: No bowel or bladder incontinence.  Musculoskeletal:     Cervical back: Normal range of motion. No swelling, deformity, signs of trauma, rigidity, spasms, tenderness, bony tenderness or crepitus. No pain with movement.     Thoracic back: No swelling, deformity, signs of trauma, spasms, tenderness or bony tenderness. Normal range of motion. No scoliosis.     Lumbar back: Tenderness present. No swelling, deformity, signs of trauma, spasms or bony tenderness.  Normal range of motion. Negative right straight leg raise test and negative left straight leg raise test. No scoliosis.     Comments: Strength 5/5 in UEs and LEs.  Neurological:     General: No focal deficit present.     Mental Status: He is alert.     Cranial Nerves: No cranial nerve deficit.     Comments: Strength 5/5 in UEs and LEs.  Gait normal. Sensation intact in UEs and LEs.   Psychiatric:        Mood and Affect: Mood normal.        Behavior: Behavior normal.        Thought Content: Thought content normal.        Judgment: Judgment normal.      UC Treatments / Results  Labs (all labs ordered are listed, but only abnormal results are displayed) Labs Reviewed - No data to display  EKG   Radiology No results found.  Procedures Procedures (including critical care time)  Medications Ordered in UC Medications - No data to display  Initial Impression / Assessment and Plan / UC Course  I have reviewed the triage vital signs and the nursing notes.  Pertinent labs & imaging results that were available during my care of the patient were reviewed by me and considered in my medical decision making (see chart for details).     For your back muscle strain, metaxalone as below.   Denies pain shooting down legs, denies numbness in arms/legs, denies weakness in arms/legs, denies saddle anesthesia, denies bowel/bladder incontinence.   Final Clinical Impressions(s) / UC Diagnoses   Final diagnoses:  Muscle spasm of back     Discharge Instructions     -For your back pain, take the muscle relaxer (Metaxalone) up to 3x daily. Try this at night as it could make you drowsy.  -Use heating pad on back -You can use tylenol/ibuprofen     ED Prescriptions    Medication Sig Dispense Auth. Provider   metaxalone (SKELAXIN) 800 MG tablet Take 1 tablet (800 mg total) by mouth 3 (three) times daily. 21 tablet Hazel Sams, PA-C     PDMP not reviewed this encounter.   Hazel Sams, PA-C 05/17/20 1553

## 2020-05-24 ENCOUNTER — Encounter: Payer: Self-pay | Admitting: Family Medicine

## 2020-05-28 ENCOUNTER — Ambulatory Visit: Payer: Medicaid Other | Admitting: Family Medicine

## 2020-05-28 ENCOUNTER — Other Ambulatory Visit: Payer: Self-pay

## 2020-05-28 VITALS — BP 142/88 | HR 90 | Temp 98.7°F | Ht 65.0 in | Wt 185.4 lb

## 2020-05-28 DIAGNOSIS — M5441 Lumbago with sciatica, right side: Secondary | ICD-10-CM | POA: Diagnosis not present

## 2020-05-28 DIAGNOSIS — M5442 Lumbago with sciatica, left side: Secondary | ICD-10-CM | POA: Diagnosis not present

## 2020-05-28 MED ORDER — TIZANIDINE HCL 2 MG PO CAPS: 4.0000 mg | ORAL_CAPSULE | Freq: Three times a day (TID) | ORAL | 0 refills | Status: DC | PRN

## 2020-05-28 MED ORDER — NAPROXEN 500 MG PO TABS: 500.0000 mg | ORAL_TABLET | Freq: Two times a day (BID) | ORAL | 0 refills | Status: DC

## 2020-05-28 NOTE — Patient Instructions (Addendum)
It was great seeing you today!  Stop by the [facility] at  [address] [place], [place] [address] to pick up your medications.   For your pain: Take 1000 mg of tylenol with one Naproxen  Sodium tablet.  Take your first muscle relaxer at bedtime.   Watch for worsening symptoms such as an increasing weakness or loss of sensation legs, increasing pain and the loss of bladder or bowel function. Should any of these occur, go to the emergency department immediately.    If you have questions or concerns please do not hesitate to call at [phone].  Dr. [facility]   Back Pain Exercises Ask your health care provider which exercises are safe for you. Do exercises exactly as told by your health care provider and adjust them as directed. It is normal to feel mild stretching, pulling, tightness, or discomfort as you do these exercises. Stop right away if you feel sudden pain or your pain gets worse. Do not begin these exercises until told by your health care provider. Stretching and range-of-motion exercises These exercises warm up your muscles and joints and improve the movement and flexibility of your hips and back. These exercises also help to relieve pain, numbness, and tingling. Sciatic nerve glide 1. Sit in a chair with your head facing down toward your chest. Place your hands behind your back. Let your shoulders slump forward. 2. Slowly straighten one of your legs while you tilt your head back as if you are looking toward the ceiling. Only straighten your leg as far as you can without making your symptoms worse. 3. Hold this position for __________ seconds. 4. Slowly return to the starting position. 5. Repeat with your other leg. Repeat __________ times. Complete this exercise __________ times a day. Knee to chest with hip adduction and internal rotation 1. Lie on your back on a firm surface with both legs straight. 2. Bend one of your knees and move it up  toward your chest until you feel a gentle stretch in your lower back and buttock. Then, move your knee toward the shoulder that is on the opposite side from your leg. This is hip adduction and internal rotation. ? Hold your leg in this position by holding on to the front of your knee. 3. Hold this position for __________ seconds. 4. Slowly return to the starting position. 5. Repeat with your other leg. Repeat __________ times. Complete this exercise __________ times a day.   Prone extension on elbows 1. Lie on your abdomen on a firm surface. A bed may be too soft for this exercise. 2. Prop yourself up on your elbows. 3. Use your arms to help lift your chest up until you feel a gentle stretch in your abdomen and your lower back. ? This will place some of your body weight on your elbows. If this is uncomfortable, try stacking pillows under your chest. ? Your hips should stay down, against the surface that you are lying on. Keep your hip and back muscles relaxed. 4. Hold this position for __________ seconds. 5. Slowly relax your upper body and return to the starting position. Repeat __________ times. Complete this exercise __________ times a day.   Strengthening exercises These exercises build strength and endurance in your back. Endurance is the ability to use your muscles for a long time, even after they get tired. Pelvic tilt This exercise strengthens the muscles that lie deep in the abdomen. 1. Lie on your back  on a firm surface. Bend your knees and keep your feet flat on the floor. 2. Tense your abdominal muscles. Tip your pelvis up toward the ceiling and flatten your lower back into the floor. ? To help with this exercise, you may place a small towel under your lower back and try to push your back into the towel. 3. Hold this position for __________ seconds. 4. Let your muscles relax completely before you repeat this exercise. Repeat __________ times. Complete this exercise __________  times a day. Alternating arm and leg raises 1. Get on your hands and knees on a firm surface. If you are on a hard floor, you may want to use padding, such as an exercise mat, to cushion your knees. 2. Line up your arms and legs. Your hands should be directly below your shoulders, and your knees should be directly below your hips. 3. Lift your left leg behind you. At the same time, raise your right arm and straighten it in front of you. ? Do not lift your leg higher than your hip. ? Do not lift your arm higher than your shoulder. ? Keep your abdominal and back muscles tight. ? Keep your hips facing the ground. ? Do not arch your back. ? Keep your balance carefully, and do not hold your breath. 4. Hold this position for __________ seconds. 5. Slowly return to the starting position. 6. Repeat with your right leg and your left arm. Repeat __________ times. Complete this exercise __________ times a day.   Posture and body mechanics Good posture and healthy body mechanics can help to relieve stress in your body's tissues and joints. Body mechanics refers to the movements and positions of your body while you do your daily activities. Posture is part of body mechanics. Good posture means:  Your spine is in its natural S-curve position (neutral).  Your shoulders are pulled back slightly.  Your head is not tipped forward. Follow these guidelines to improve your posture and body mechanics in your everyday activities. Standing  When standing, keep your spine neutral and your feet about hip width apart. Keep a slight bend in your knees. Your ears, shoulders, and hips should line up.  When you do a task in which you stand in one place for a long time, place one foot up on a stable object that is 2-4 inches (5-10 cm) high, such as a footstool. This helps keep your spine neutral.   Sitting  When sitting, keep your spine neutral and keep your feet flat on the floor. Use a footrest, if necessary, and  keep your thighs parallel to the floor. Avoid rounding your shoulders, and avoid tilting your head forward.  When working at a desk or a computer, keep your desk at a height where your hands are slightly lower than your elbows. Slide your chair under your desk so you are close enough to maintain good posture.  When working at a computer, place your monitor at a height where you are looking straight ahead and you do not have to tilt your head forward or downward to look at the screen.   Resting  When lying down and resting, avoid positions that are most painful for you.  If you have pain with activities such as sitting, bending, stooping, or squatting, lie in a position in which your body does not bend very much. For example, avoid curling up on your side with your arms and knees near your chest (fetal position).  If you have  pain with activities such as standing for a long time or reaching with your arms, lie with your spine in a neutral position and bend your knees slightly. Try the following positions: ? Lying on your side with a pillow between your knees. ? Lying on your back with a pillow under your knees. Lifting  When lifting objects, keep your feet at least shoulder width apart and tighten your abdominal muscles.  Bend your knees and hips and keep your spine neutral. It is important to lift using the strength of your legs, not your back. Do not lock your knees straight out.  Always ask for help to lift heavy or awkward objects.   This information is not intended to replace advice given to you by your health care provider. Make sure you discuss any questions you have with your health care provider. Document Revised: 08/26/2018 Document Reviewed: 05/26/2018 Elsevier Patient Education  [place].

## 2020-05-28 NOTE — Progress Notes (Signed)
   SUBJECTIVE:   CHIEF COMPLAINT / HPI:   Chief Complaint  Patient presents with  . Back Pain   A [place] interpreter was used for this encounter:  Name: David Owen is a 60 y.o. male here for back pain  Back Pain  Started about 2 weeks ago and was lifting heavy box at work.  He was seen at the urgent care and given a muscle relaxer however this was not covered by his insurance therefore the patient did not pick it up.  He has been using methanol gel without relief.  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  Back pain is worse with sitting and bending forward.  Denies fever, perianal numbness, cancer, unexplained weight loss, immunosuppression, prolonged use of steroids, history of IV drug use, urinary tract infection, pain that is increased or unrelieved by rest, bladder or bowel incontinence, significant trauma related to age.  Pain radiates to both lower extremities.  He has missed work secondary to pain.   PERTINENT  PMH / PSH: reviewed and updated as appropriate   OBJECTIVE:   BP (!) 142/88   Pulse 90   Temp 98.7 F (37.1 C) (Oral)   Ht 5\' 5"  (1.651 m)   Wt 185 lb 6.4 oz (84.1 kg)   SpO2 99%   BMI 30.85 kg/m    GEN: pleasant but uncomfortable appearing older male, in no acute distress  CV: regular rate and rhythm, distal pulses intact RESP: no increased work of breathing, clear to ascultation bilaterally EXT: no edema, or tenderness BACK: T and L-spine hypertonicity and tenderness however no midline tenderness, negative bilateral straight leg test, neurovascularly intact SKIN: warm, dry NEURO: Alert, moves all extremities appropriately, strength 5/5 bilateral upper and lower extremities    ASSESSMENT/PLAN:   Acute low back pain with bilateral sciatica Discussed with patient gradually returning to normal activities, as tolerated. Pt to continue ordinary activities within the limits permitted by  pain. Will prescribe Naproxen sodium and Zanaflex for pain relief.  Advised patient to avoid other NSAIDs while taking Naprosyn medication. Counseled patient on red flag symptoms and when to seek immediate care.  No red flags suggesting cauda equina syndrome or progressive major motor weakness. Patient to return if symptoms do not improve with conservative treatment in 4-6 weeks.  Consider imaging at that time.  Work accommodations recommended and note provided.    Lyndee Hensen, DO PGY-2, [facility] 05/28/2020

## 2020-05-29 ENCOUNTER — Telehealth: Payer: Self-pay

## 2020-05-29 NOTE — Telephone Encounter (Signed)
Family friend called nurse line yesterday informing me the patient was not able to pick up Tizanidine due to insurance coverage. I called the pharmacy and switched capsules to tablets, as medicaid prefers tabs. I attempted to call "family friend" back to inform, however no answer. If he calls back please let him know the patient should be able to pick up their medication today.

## 2020-06-01 DIAGNOSIS — M5441 Lumbago with sciatica, right side: Secondary | ICD-10-CM | POA: Insufficient documentation

## 2020-06-01 DIAGNOSIS — M5442 Lumbago with sciatica, left side: Secondary | ICD-10-CM | POA: Insufficient documentation

## 2020-06-01 NOTE — Assessment & Plan Note (Signed)
Discussed with patient gradually returning to normal activities, as tolerated. Pt to continue ordinary activities within the limits permitted by pain. Will prescribe Naproxen sodium and Zanaflex for pain relief.  Advised patient to avoid other NSAIDs while taking Naprosyn medication. Counseled patient on red flag symptoms and when to seek immediate care.  No red flags suggesting cauda equina syndrome or progressive major motor weakness. Patient to return if symptoms do not improve with conservative treatment in 4-6 weeks.  Consider imaging at that time.  Work accommodations recommended and note provided.

## 2020-06-05 ENCOUNTER — Ambulatory Visit: Payer: Medicaid Other | Admitting: Family Medicine

## 2020-06-05 ENCOUNTER — Encounter: Payer: Self-pay | Admitting: Family Medicine

## 2020-06-05 ENCOUNTER — Other Ambulatory Visit: Payer: Self-pay

## 2020-06-05 DIAGNOSIS — M5441 Lumbago with sciatica, right side: Secondary | ICD-10-CM

## 2020-06-05 DIAGNOSIS — I10 Essential (primary) hypertension: Secondary | ICD-10-CM | POA: Diagnosis not present

## 2020-06-05 DIAGNOSIS — M5442 Lumbago with sciatica, left side: Secondary | ICD-10-CM | POA: Diagnosis not present

## 2020-06-05 DIAGNOSIS — G5603 Carpal tunnel syndrome, bilateral upper limbs: Secondary | ICD-10-CM

## 2020-06-05 NOTE — Assessment & Plan Note (Signed)
Given his numerous mildly elevated calcium, we will go ahead and obtain a PTH today.  BMP also obtained.

## 2020-06-05 NOTE — Assessment & Plan Note (Signed)
Discussed with patient the importance of wearing his wrist splints every night.  He will attempt to do this.  Did discuss briefly that treatment in the future could be steroid injections, but will hold off at this time.

## 2020-06-05 NOTE — Assessment & Plan Note (Signed)
Improved with decreased lifting.  No changes needed at this time.

## 2020-06-05 NOTE — Progress Notes (Signed)
    SUBJECTIVE:   CHIEF COMPLAINT / HPI:   Back pain Patient seen on 1/11 by Dr. Susa Simmonds for back pain At that time patient had no red flag symptoms Was prescribed naproxen and Zanaflex for pain relief Used to lift heavy things at work, but got a note and has changed his work   Carpal tunnel syndrome He is still having cramping in his hands Was given Wrist splints, but he states that sometimes it hurts so badly that he can't wear it Feels like he struggles to sleep because the braces are heavy Wears it about 2 times a week currently   HTN Current regimen: HCTZ 25 mg daily, losartan 25 mg daily Last BMP obtained on 2/13, potassium 3.4 at that time but patient could not be contacted Reports that he is taking his medications, but gets confused because "he has so many medications" He states that he took all of his medications this morning BP machine doesn't work well   Hypercalcemia Patient has had persistently mildly elevated calcium and 10.3-10.5 range on BMP checks  [employer] [place] interpretor used for entirety of encounter, [phone]  PERTINENT  PMH / PSH: HTN, HLD, acid reflux, history of treated syphilis, chronic back pain, hemorrhoids and constipation  OBJECTIVE:   BP (!) 150/90   Pulse 69   Wt 186 lb 2 oz (84.4 kg)   BMI 30.97 kg/m    Physical Exam:  General: 60 y.o. male in NAD Cardio: RRR no m/r/g Lungs: CTAB, no wheezing, no rhonchi, no crackles, no IWOB on RA Skin: warm and dry Extremities: No edema   ASSESSMENT/PLAN:   Essential hypertension Still very unclear what medications he is actually taking.  Did not bring them with him today.  Given this, we will not make any changes to his regimen at present.  Advised him to bring his medications to his next visit.  For now, will say to continue hydrochlorothiazide 25 mg daily and losartan 25 mg daily.  Have plenty of room to go up on losartan.  We will obtain a BMP today given his low potassium at last  visit.  Hypercalcemia Given his numerous mildly elevated calcium, we will go ahead and obtain a PTH today.  BMP also obtained.  Carpal tunnel syndrome Discussed with patient the importance of wearing his wrist splints every night.  He will attempt to do this.  Did discuss briefly that treatment in the future could be steroid injections, but will hold off at this time.  Acute low back pain with bilateral sciatica Improved with decreased lifting.  No changes needed at this time.     Cleophas Dunker, [place]

## 2020-06-05 NOTE — Assessment & Plan Note (Signed)
Still very unclear what medications he is actually taking.  Did not bring them with him today.  Given this, we will not make any changes to his regimen at present.  Advised him to bring his medications to his next visit.  For now, will say to continue hydrochlorothiazide 25 mg daily and losartan 25 mg daily.  Have plenty of room to go up on losartan.  We will obtain a BMP today given his low potassium at last visit.

## 2020-06-05 NOTE — Patient Instructions (Signed)
Thank you for coming to see me today. It was a pleasure. Today we talked about:   We will get some labs today.  If they are abnormal or we need to do something about them, I will call you.  If they are normal, I will send you a message on MyChart (if it is active) or a letter in the mail.  If you don't hear from [place] in 2 weeks, please call the office at the number below.  Bring all of your medications back with your next time.    Wear your wrist splints as much as you can.  Please follow-up with me in 1 month.  If you have any questions or concerns, please do not hesitate to call the office at [phone].  Best,   [place] Constable, DO

## 2020-06-06 LAB — BASIC METABOLIC PANEL
BUN/Creatinine Ratio: 15 (ref 9–20)
BUN: 19 mg/dL (ref 6–24)
CO2: 23 mmol/L (ref 20–29)
Calcium: 10.2 mg/dL (ref 8.7–10.2)
Chloride: 102 mmol/L (ref 96–106)
Creatinine, Ser: 1.26 mg/dL (ref 0.76–1.27)
GFR calc Af Amer: 72 mL/min/{1.73_m2} (ref >59)
GFR calc non Af Amer: 62 mL/min/{1.73_m2} (ref >59)
Glucose: 104 mg/dL — ABNORMAL HIGH (ref 65–99)
Potassium: 4.2 mmol/L (ref 3.5–5.2)
Sodium: 141 mmol/L (ref 134–144)

## 2020-06-06 LAB — PTH, INTACT AND CALCIUM: PTH: 35 pg/mL (ref 15–65)

## 2020-07-01 ENCOUNTER — Encounter: Payer: Self-pay | Admitting: Family Medicine

## 2020-07-01 ENCOUNTER — Ambulatory Visit (INDEPENDENT_AMBULATORY_CARE_PROVIDER_SITE_OTHER): Payer: Self-pay | Admitting: Family Medicine

## 2020-07-01 ENCOUNTER — Other Ambulatory Visit: Payer: Self-pay

## 2020-07-01 DIAGNOSIS — M7662 Achilles tendinitis, left leg: Secondary | ICD-10-CM | POA: Insufficient documentation

## 2020-07-01 DIAGNOSIS — I10 Essential (primary) hypertension: Secondary | ICD-10-CM

## 2020-07-01 DIAGNOSIS — M79642 Pain in left hand: Secondary | ICD-10-CM

## 2020-07-01 DIAGNOSIS — M79641 Pain in right hand: Secondary | ICD-10-CM

## 2020-07-01 NOTE — Patient Instructions (Addendum)
Thank you for coming to see me today. It was a pleasure. Today we talked about:   Get some soft insoles for your shoes.  You can get these at [facility].  You can also use tylenol to help with your pain.  Please follow-up with me on 3/17 at 8:30 am  If you have any questions or concerns, please do not hesitate to call the office at [phone].  Best,   Arizona Constable, DO

## 2020-07-01 NOTE — Assessment & Plan Note (Addendum)
His pain seems to be located over the Achilles tendon on the left.  Symptoms have been rather short-lived.  He wears mostly shoes that do not have support, therefore will start with over-the-counter insoles with cushioning for the heel.  Could consider custom orthotics in the future, however cost would likely be an issue.  He has no injury, no tenderness over bony prominences, no imaging needed at this time.  Can continue to use Tylenol as needed.

## 2020-07-01 NOTE — Assessment & Plan Note (Signed)
Now resolved.  PTH also within normal limits.  No other intervention needed at this time.

## 2020-07-01 NOTE — Assessment & Plan Note (Signed)
Reiterated to patient that this will likely be a longstanding issue, but hopefully can have some relief from continued use of wrist splints.  Also showed how to stretch carpal tunnel and hands.  Advised him and his wife to perform this on each other as they both have similar symptoms.

## 2020-07-01 NOTE — Assessment & Plan Note (Signed)
Blood pressures well controlled today.  BMP at last office visit within normal limits.  Continue with current regimen.  Patient brings his medications with him today and it does appear he is taking the appropriate medications.

## 2020-07-01 NOTE — Progress Notes (Signed)
    SUBJECTIVE:   CHIEF COMPLAINT / HPI:   Hypercalcemia Now resolved Calcium and PTH were within normal limits on last check on 1/19  Hypertension Current regimen: HCTZ 25 mg daily, losartan 25 mg daily, norvasc 10mg  K within normal limits on last BMP check on 1/19 At last visit were unclear on what medications he is taking He brought medications with him today  Left Ankle Pain Posterior Ankle Pain 3 weeks Has pain with walking No history of injury Just uses a cream to massage, but otherwise not using anything else Uses protective shoes that he was given at work  Bilateral Wrist and Hand Pain Has been given wrist splints but doesn't use every night, because it makes it hard to sleep Doesn't notice a difference when he does use it Notes some cramping in his hands as well Drinks a lot of water during the day Doesn't have this pain every morning  iPad [place] Interpretor used for entirety of encounter  PERTINENT  PMH / PSH: HTN, GERD, carpal tunnel syndrome, history of syphilis, HLD  OBJECTIVE:   BP 124/76   Pulse 77   Ht 5\' 5"  (1.651 m)   Wt 185 lb 3.2 oz (84 kg)   SpO2 99%   BMI 30.82 kg/m    Physical Exam:  General: 60 y.o. male in NAD Cardio: RRR no m/r/g Lungs: CTAB, no wheezing, no rhonchi, no crackles, no IWOB on RA Skin: warm and dry  Left Ankle: - Inspection: No obvious deformity, erythema, swelling, or ecchymosis, ulcers, calluses, blisters b/l - Palpation: No TTP at MT heads, no TTP at base of 5th MT, no TTP over cuboid, no tenderness over navicular prominence, no TTP over lateral or medial malleolus.  No sign of peroneal tendon subluxation or TTP. TTP over left achilles insertion - Strength: Normal strength with dorsiflexion, plantarflexion, inversion, and eversion of foot; flexion and extension of toes b/l - ROM: Full ROM b/l - Neuro/vasc: NV intact b/l - Special Tests: Negative anterior drawer, normal inversion tes  Feet: no deformity noted.         ASSESSMENT/PLAN:   Hypercalcemia Now resolved.  PTH also within normal limits.  No other intervention needed at this time.  Bilateral hand pain Reiterated to patient that this will likely be a longstanding issue, but hopefully can have some relief from continued use of wrist splints.  Also showed how to stretch carpal tunnel and hands.  Advised him and his wife to perform this on each other as they both have similar symptoms.  Left Achilles tendinitis His pain seems to be located over the Achilles tendon on the left.  Symptoms have been rather short-lived.  He wears mostly shoes that do not have support, therefore will start with over-the-counter insoles with cushioning for the heel.  Could consider custom orthotics in the future, however cost would likely be an issue.  He has no injury, no tenderness over bony prominences, no imaging needed at this time.  Can continue to use Tylenol as needed.  Essential hypertension Blood pressures well controlled today.  BMP at last office visit within normal limits.  Continue with current regimen.  Patient brings his medications with him today and it does appear he is taking the appropriate medications.     Cleophas Dunker, [place]

## 2020-08-01 ENCOUNTER — Telehealth: Payer: Self-pay | Admitting: Family Medicine

## 2020-08-01 ENCOUNTER — Ambulatory Visit: Payer: Self-pay | Admitting: Family Medicine

## 2020-08-01 NOTE — Telephone Encounter (Signed)
Called daughter's number as have previously been able to reach patient at this number.  No answer, VM not set up.  Patient and his wife were scheduled for appointments this AM but did not show.  Appears they are establishing care elsewhere.  Was calling to offer that even though they no longer have insurance [facility] can continue to see them and work with them to get mediactions.  If they call back, please let them know the above.

## 2020-08-05 ENCOUNTER — Encounter: Payer: Self-pay | Admitting: Family Medicine

## 2020-08-05 ENCOUNTER — Other Ambulatory Visit: Payer: Self-pay | Admitting: Family Medicine

## 2020-08-05 ENCOUNTER — Ambulatory Visit: Payer: Self-pay | Admitting: Clinical

## 2020-08-05 ENCOUNTER — Ambulatory Visit (INDEPENDENT_AMBULATORY_CARE_PROVIDER_SITE_OTHER): Payer: Self-pay | Admitting: Family Medicine

## 2020-08-05 ENCOUNTER — Other Ambulatory Visit: Payer: Self-pay

## 2020-08-05 VITALS — BP 167/90 | HR 64 | Temp 97.5°F | Ht 65.0 in | Wt 181.0 lb

## 2020-08-05 DIAGNOSIS — I10 Essential (primary) hypertension: Secondary | ICD-10-CM

## 2020-08-05 DIAGNOSIS — Z758 Other problems related to medical facilities and other health care: Secondary | ICD-10-CM

## 2020-08-05 DIAGNOSIS — T464X5A Adverse effect of angiotensin-converting-enzyme inhibitors, initial encounter: Secondary | ICD-10-CM

## 2020-08-05 DIAGNOSIS — R000 Tachycardia, unspecified: Secondary | ICD-10-CM

## 2020-08-05 DIAGNOSIS — Z789 Other specified health status: Secondary | ICD-10-CM

## 2020-08-05 DIAGNOSIS — H04123 Dry eye syndrome of bilateral lacrimal glands: Secondary | ICD-10-CM

## 2020-08-05 DIAGNOSIS — R058 Other specified cough: Secondary | ICD-10-CM

## 2020-08-05 DIAGNOSIS — Z0000 Encounter for general adult medical examination without abnormal findings: Secondary | ICD-10-CM

## 2020-08-05 DIAGNOSIS — R634 Abnormal weight loss: Secondary | ICD-10-CM

## 2020-08-05 DIAGNOSIS — E782 Mixed hyperlipidemia: Secondary | ICD-10-CM

## 2020-08-05 DIAGNOSIS — I998 Other disorder of circulatory system: Secondary | ICD-10-CM

## 2020-08-05 DIAGNOSIS — Z09 Encounter for follow-up examination after completed treatment for conditions other than malignant neoplasm: Secondary | ICD-10-CM

## 2020-08-05 DIAGNOSIS — Z5989 Other problems related to housing and economic circumstances: Secondary | ICD-10-CM

## 2020-08-05 DIAGNOSIS — N529 Male erectile dysfunction, unspecified: Secondary | ICD-10-CM

## 2020-08-05 DIAGNOSIS — Z7689 Persons encountering health services in other specified circumstances: Secondary | ICD-10-CM

## 2020-08-05 LAB — POCT URINALYSIS DIPSTICK
Bilirubin, UA: NEGATIVE
Blood, UA: NEGATIVE
Glucose, UA: NEGATIVE
Ketones, UA: NEGATIVE
Leukocytes, UA: NEGATIVE
Nitrite, UA: NEGATIVE
Protein, UA: NEGATIVE
Spec Grav, UA: 1.01 (ref 1.010–1.025)
Urobilinogen, UA: 0.2 E.U./dL
pH, UA: 6 (ref 5.0–8.0)

## 2020-08-05 MED ORDER — CYCLOSPORINE 0.05 % OP EMUL: 1.0000 [drp] | Freq: Two times a day (BID) | OPHTHALMIC | 1 refills | Status: DC

## 2020-08-05 MED ORDER — SILDENAFIL CITRATE 50 MG PO TABS: 50.0000 mg | ORAL_TABLET | Freq: Every day | ORAL | 1 refills | Status: DC | PRN

## 2020-08-05 MED ORDER — HYDROCHLOROTHIAZIDE 25 MG PO TABS: 25.0000 mg | ORAL_TABLET | Freq: Every day | ORAL | 3 refills | Status: DC

## 2020-08-05 MED ORDER — AMLODIPINE BESYLATE 10 MG PO TABS: 10.0000 mg | ORAL_TABLET | Freq: Every day | ORAL | 3 refills | Status: DC

## 2020-08-05 MED ORDER — LOSARTAN POTASSIUM 25 MG PO TABS: 25.0000 mg | ORAL_TABLET | Freq: Every day | ORAL | 3 refills | Status: DC

## 2020-08-05 MED ORDER — ATORVASTATIN CALCIUM 20 MG PO TABS: 20.0000 mg | ORAL_TABLET | Freq: Every day | ORAL | 3 refills | Status: DC

## 2020-08-05 MED FILL — SILDENAFIL CITRATE 50 MG TA: 50 | 30 days supply | Qty: 10 | Fill #0

## 2020-08-05 MED FILL — AMLODIPINE BESYLATE 10 MG T: 10 | 30 days supply | Qty: 30 | Fill #0

## 2020-08-05 MED FILL — HYDROCHLOROTHIAZIDE 25 MG T: 25 | 30 days supply | Qty: 30 | Fill #0

## 2020-08-05 MED FILL — LOSARTAN POTASSIUM 25 MG TA: 25 | 30 days supply | Qty: 30 | Fill #0

## 2020-08-05 MED FILL — ATORVASTATIN CALCIUM 20 MG: 20 | 30 days supply | Qty: 30 | Fill #0

## 2020-08-05 NOTE — Progress Notes (Signed)
Integrated Behavioral Health Case Management Referral Note  08/05/2020 Name: David Owen MRN: [id number] DOB: [birth year] David Owen is a 60 y.o. year old male who presented today to establish care with Kathe Becton, NP. LCSW was consulted to assess patient's needs and assist the patient with Financial Difficulties related to lack of health insurance and low income.  Interpreter: Yes.     Interpreter Name & Language: [place]  Assessment: Patient experiencing financial difficulties related to lack of health insurance and low income.   Intervention: Patient and wife were referred to Patient [facility]) by [employer]. Patient previously had temporary Medicaid but this coverage has expired.  CSW met with patient, patient's wife David Owen, and daughter with interpreter. Referred patient and wife to [facility] of [facility]) for assistance with enrolling in [place] insurance coverage during current special enrollment period. Patient and wife also interested in applying for [employer]  and for [employer] if they turn out to not be eligible for [facility] plan or cannot afford one.   Provided patient with [employer] and [employer] applications. Reviewed application and advised patient on supporting documents to be submitted with the application. Advised patient to follow up with CSW for assistance in scheduling appointment with financial counselor at [facility]) to submit the application if needed. Provided CSW contact information.   Patient requested his prescriptions be sent to [facility]acy rather than [place]. CSW sent this request to patient's provider, Kathe Becton.  Review of patient status, including review of consultants reports, relevant laboratory and other test results, and collaboration with appropriate care team members and the patient's provider was performed as part  of comprehensive patient evaluation and provision of services.    SDOH (Social Determinants of Health) assessments performed: No    Goals Addressed   None      Follow up Plan: 1. CSW to follow up with patient after contact with [facility] and determine if they would still like to apply for [employer] or not. 2. CSW available from clinic as needed.   David Owen, [facility] [phone]

## 2020-08-05 NOTE — Progress Notes (Signed)
Patient [facility]   New Patient--Establish Care  Subjective:  Patient ID: Reginald Weida, male    DOB: May 20, 1960  Age: 60 y.o. MRN: [id number]  CC:  Chief Complaint  Patient presents with  . New Patient (Initial Visit)    Est care , new patient , blood pressure, taking medication for b/p , checking  reading at home having high reading     HPI Myreon Wimer is a 60 year old male who is here to [place] today.    Patient Active Problem List   Diagnosis Date Noted  . Left Achilles tendinitis 07/01/2020  . Hypercalcemia 06/05/2020  . Bilateral hand pain 04/30/2020  . Carpal tunnel syndrome 04/29/2020  . ACE-inhibitor cough 04/04/2020  . Chronic cough 03/24/2020  . Erectile dysfunction 02/28/2020  . Dry eye syndrome of both eyes 12/18/2019  . Mixed hyperlipidemia 11/28/2019  . Essential hypertension 11/21/2019  . History of syphilis 11/21/2019  . Gastroesophageal reflux disease 11/21/2019  . Chronic bilateral low back pain without sciatica 11/21/2019  . Refugee health examination 11/19/2019   Current Status: This will be Mr. Beichner initial office visit with me. He was previously seeing Arizona Constable, MD for his PCP needs. He had changes in his insurance coverage, and was transferred to our office. Since his last office visit, he is doing well with no complaints. He is originally from [place] which he has been in the [place] now. He states that he has occasional heart palpitations and chest discomfort. He denies visual changes, chest pain, cough, shortness of breath, and falls. He has occasional headaches and dizziness with position changes. Denies severe headaches, confusion, seizures, double vision, and blurred vision, nausea and vomiting. His blood pressures are elevated today, which he states that he does not take medications daily as prescribed. He denies fevers, chills, fatigue, recent infections, weight  loss, and night sweats. No reports of GI problems such as diarrhea, and constipation. He has no reports of blood in stools, dysuria and hematuria. No depression or anxiety reported today. He is accompanied today by an Astronomer. He denies pain today.    Past Medical History:  Diagnosis Date  . Acid reflux   . Chronic ischemic heart disease 01/2020  . Chronic kidney disease    stage III  . Constipation    on miralax daily   . Hemorrhoids   . Hyperlipidemia   . Hypertension   . Sinus tachycardia by electrocardiogram 01/2020    Past Surgical History:  Procedure Laterality Date  . COLONOSCOPY  01/2020   He will repeat colonoscopy in 7 years  . MULTIPLE TOOTH EXTRACTIONS    . NO PAST SURGERIES      Family History  Problem Relation Age of Onset  . Colon cancer Neg Hx   . Colon polyps Neg Hx   . Esophageal cancer Neg Hx   . Rectal cancer Neg Hx   . Stomach cancer Neg Hx     Social History   Socioeconomic History  . Marital status: Married    Spouse name: Not on file  . Number of children: Not on file  . Years of education: Not on file  . Highest education level: Not on file  Occupational History  . Not on file  Tobacco Use  . Smoking status: Never Smoker  . Smokeless tobacco: Never Used  Substance and Sexual Activity  . Alcohol use: Never  . Drug use: Never  .  Sexual activity: Not on file  Other Topics Concern  . Not on file  Social History Narrative  . Not on file   Social Determinants of Health   Financial Resource Strain: Not on file  Food Insecurity: Not on file  Transportation Needs: Not on file  Physical Activity: Not on file  Stress: Not on file  Social Connections: Not on file  Intimate Partner Violence: Not on file    Outpatient Medications Prior to Visit  Medication Sig Dispense Refill  . acetaminophen (TYLENOL 8 HOUR) 650 MG CR tablet Take 1 tablet (650 mg total) by mouth every 8 (eight) hours as needed for pain. 60 tablet 1  . amLODipine  (NORVASC) 10 MG tablet Take 1 tablet (10 mg total) by mouth at bedtime. 90 tablet 3  . atorvastatin (LIPITOR) 20 MG tablet Take 1 tablet (20 mg total) by mouth daily. 90 tablet 3  . hydrochlorothiazide (HYDRODIURIL) 25 MG tablet Take 1 tablet (25 mg total) by mouth daily. 90 tablet 3  . losartan (COZAAR) 25 MG tablet Take 1 tablet (25 mg total) by mouth at bedtime. 90 tablet 3  . sildenafil (VIAGRA) 50 MG tablet Take 1 tablet (50 mg total) by mouth daily as needed for erectile dysfunction. 10 tablet 0  . cycloSPORINE (RESTASIS) 0.05 % ophthalmic emulsion Place 1 drop into both eyes 2 (two) times daily. (Patient not taking: Reported on 08/05/2020) 0.4 mL 1   No facility-administered medications prior to visit.    No Known Allergies  ROS Review of Systems  Constitutional: Negative.   HENT: Negative.   Eyes: Negative.   Respiratory: Positive for cough (occasional ).   Cardiovascular: Negative.   Gastrointestinal: Positive for abdominal distention (obese).  Endocrine: Negative.   Genitourinary: Negative.   Musculoskeletal: Positive for arthralgias (generalized joint pain).  Skin: Negative.   Allergic/Immunologic: Negative.   Neurological: Positive for dizziness (occasional) and headaches (occasional ).  Hematological: Negative.   Psychiatric/Behavioral: Negative.    Objective:    Physical Exam Vitals and nursing note reviewed.  Constitutional:      Appearance: Normal appearance.  HENT:     Head: Normocephalic and atraumatic.     Nose: Nose normal.     Mouth/Throat:     Mouth: Mucous membranes are moist.     Pharynx: Oropharynx is clear.  Cardiovascular:     Rate and Rhythm: Normal rate and regular rhythm.     Pulses: Normal pulses.     Heart sounds: Normal heart sounds.  Pulmonary:     Effort: Pulmonary effort is normal.     Breath sounds: Normal breath sounds.  Abdominal:     General: Bowel sounds are normal.  Musculoskeletal:     Cervical back: Neck supple.   Neurological:     Mental Status: He is alert.     BP (!) 167/90 (BP Location: Left Arm, Patient Position: Sitting, Cuff Size: Large)   Pulse 64   Temp (!) 97.5 F (36.4 C) (Temporal)   Ht 5\' 5"  (1.651 m)   Wt 181 lb (82.1 kg)   SpO2 99%   BMI 30.12 kg/m  Wt Readings from Last 3 Encounters:  08/05/20 181 lb (82.1 kg)  07/01/20 185 lb 3.2 oz (84 kg)  06/05/20 186 lb 2 oz (84.4 kg)     There are no preventive care reminders to display for this patient.  There are no preventive care reminders to display for this patient.  Lab Results  Component Value Date  TSH 2.540 11/21/2019   Lab Results  Component Value Date   WBC 8.3 02/15/2020   HGB 14.5 02/15/2020   HCT 43.4 02/15/2020   MCV 86.1 02/15/2020   PLT 228 02/15/2020   Lab Results  Component Value Date   NA 141 06/05/2020   K 4.2 06/05/2020   CO2 23 06/05/2020   GLUCOSE 104 (H) 06/05/2020   BUN 19 06/05/2020   CREATININE 1.26 06/05/2020   BILITOT 0.2 11/21/2019   ALKPHOS 119 11/21/2019   AST 24 11/21/2019   ALT 43 11/21/2019   PROT 7.3 11/21/2019   ALBUMIN 4.7 11/21/2019   CALCIUM 10.2 06/05/2020   ANIONGAP 11 02/15/2020   Lab Results  Component Value Date   CHOL 263 (H) 11/21/2019   Lab Results  Component Value Date   HDL 41 11/21/2019   Lab Results  Component Value Date   LDLCALC 142 (H) 11/21/2019   Lab Results  Component Value Date   TRIG 432 (H) 11/21/2019   Lab Results  Component Value Date   CHOLHDL 6.4 (H) 11/21/2019   No results found for: HGBA1C    Assessment & Plan:   1. Language barrier  2. Encounter to establish care  2. Essential hypertension The current medical regimen as prescribed. He will continue to take medications as prescribed, to decrease high sodium intake, excessive alcohol intake, increase potassium intake, smoking cessation, and increase physical activity of at least 30 minutes of cardio activity daily. He will continue to follow Heart Healthy or DASH  diet. - POCT Urinalysis Dipstick  3. Sinus tachycardia by electrocardiogram Occasional. Stable today. No signs or symptoms of respiratory distress noted or reported today.  - Ambulatory referral to Cardiology  4. Ischemia Occasional chest discomfort. Monitor.  - Ambulatory referral to Cardiology  5. Weight loss  6. Mixed hyperlipidemia  7. ACE-inhibitor cough Stable today. Monitor.   8. Erectile dysfunction, unspecified erectile dysfunction type - sildenafil (VIAGRA) 50 MG tablet; Take 1 tablet (50 mg total) by mouth daily as needed for erectile dysfunction.  Dispense: 10 tablet; Refill: 1  9. Dry eye syndrome of both eyes - cycloSPORINE (RESTASIS) 0.05 % ophthalmic emulsion; Place 1 drop into both eyes 2 (two) times daily.  Dispense: 0.4 mL; Refill: 1  10. Healthcare maintenance - CBC with Differential - Comprehensive metabolic panel - TSH - Lipid Panel - PSA - Vitamin B12 - Vitamin D, 25-hydroxy  11. Follow up He will follow up in 3 months .  Meds ordered this encounter  Medications  . sildenafil (VIAGRA) 50 MG tablet    Sig: Take 1 tablet (50 mg total) by mouth daily as needed for erectile dysfunction.    Dispense:  10 tablet    Refill:  1  . cycloSPORINE (RESTASIS) 0.05 % ophthalmic emulsion    Sig: Place 1 drop into both eyes 2 (two) times daily.    Dispense:  0.4 mL    Refill:  1    Orders Placed This Encounter  Procedures  . CBC with Differential  . Comprehensive metabolic panel  . TSH  . Lipid Panel  . PSA  . Vitamin B12  . Vitamin D, 25-hydroxy  . Ambulatory referral to Cardiology  . POCT Urinalysis Dipstick     Referral Orders     Ambulatory referral to Cardiology   Kathe Becton, MSN, ANE, FNP-BC [facility] [address] [place], [place] [address] [phone] fax  Problem List Items Addressed This Visit      Cardiovascular and  Mediastinum   Essential hypertension   Relevant Medications   sildenafil (VIAGRA) 50 MG tablet   Other Relevant Orders   POCT Urinalysis Dipstick (Completed)     Other   ACE-inhibitor cough   Dry eye syndrome of both eyes   Relevant Medications   cycloSPORINE (RESTASIS) 0.05 % ophthalmic emulsion   Erectile dysfunction   Relevant Medications   sildenafil (VIAGRA) 50 MG tablet   Mixed hyperlipidemia   Relevant Medications   sildenafil (VIAGRA) 50 MG tablet    Other Visit Diagnoses    Language barrier    -  Primary   Encounter to establish care       Sinus tachycardia by electrocardiogram       Relevant Orders   Ambulatory referral to Cardiology   Ischemia       Relevant Medications   sildenafil (VIAGRA) 50 MG tablet   Other Relevant Orders   Ambulatory referral to Cardiology   Weight loss       Healthcare maintenance       Relevant Orders   CBC with Differential   Comprehensive metabolic panel   TSH   Lipid Panel   PSA   Vitamin B12   Vitamin D, 25-hydroxy   Follow up          Meds ordered this encounter  Medications  . sildenafil (VIAGRA) 50 MG tablet    Sig: Take 1 tablet (50 mg total) by mouth daily as needed for erectile dysfunction.    Dispense:  10 tablet    Refill:  1  . cycloSPORINE (RESTASIS) 0.05 % ophthalmic emulsion    Sig: Place 1 drop into both eyes 2 (two) times daily.    Dispense:  0.4 mL    Refill:  1    Follow-up: No follow-ups on file.    Azzie Glatter, FNP

## 2020-08-06 ENCOUNTER — Ambulatory Visit: Payer: Self-pay | Admitting: Family Medicine

## 2020-08-06 LAB — CBC WITH DIFFERENTIAL/PLATELET
Basophils Absolute: 0.1 10*3/uL (ref 0.0–0.2)
Basos: 1 %
EOS (ABSOLUTE): 0.2 10*3/uL (ref 0.0–0.4)
Eos: 5 %
Hematocrit: 49.6 % (ref 37.5–51.0)
Hemoglobin: 15.9 g/dL (ref 13.0–17.7)
Immature Grans (Abs): 0 10*3/uL (ref 0.0–0.1)
Immature Granulocytes: 0 %
Lymphocytes Absolute: 1.8 10*3/uL (ref 0.7–3.1)
Lymphs: 36 %
MCH: 27.7 pg (ref 26.6–33.0)
MCHC: 32.1 g/dL (ref 31.5–35.7)
MCV: 87 fL (ref 79–97)
Monocytes Absolute: 0.4 10*3/uL (ref 0.1–0.9)
Monocytes: 8 %
Neutrophils Absolute: 2.6 10*3/uL (ref 1.4–7.0)
Neutrophils: 50 %
Platelets: 220 10*3/uL (ref 150–450)
RBC: 5.73 x10E6/uL (ref 4.14–5.80)
RDW: 13.3 % (ref 11.6–15.4)
WBC: 5.1 10*3/uL (ref 3.4–10.8)

## 2020-08-06 LAB — COMPREHENSIVE METABOLIC PANEL
ALT: 26 IU/L (ref 0–44)
AST: 22 IU/L (ref 0–40)
Albumin/Globulin Ratio: 1.7 (ref 1.2–2.2)
Albumin: 4.9 g/dL (ref 3.8–4.9)
Alkaline Phosphatase: 103 IU/L (ref 44–121)
BUN/Creatinine Ratio: 10 (ref 10–24)
BUN: 12 mg/dL (ref 8–27)
Bilirubin Total: 0.3 mg/dL (ref 0.0–1.2)
CO2: 25 mmol/L (ref 20–29)
Calcium: 10.2 mg/dL (ref 8.6–10.2)
Chloride: 103 mmol/L (ref 96–106)
Creatinine, Ser: 1.19 mg/dL (ref 0.76–1.27)
Globulin, Total: 2.9 g/dL (ref 1.5–4.5)
Glucose: 114 mg/dL — ABNORMAL HIGH (ref 65–99)
Potassium: 4.3 mmol/L (ref 3.5–5.2)
Sodium: 145 mmol/L — ABNORMAL HIGH (ref 134–144)
Total Protein: 7.8 g/dL (ref 6.0–8.5)
eGFR: 70 mL/min/{1.73_m2} (ref >59)

## 2020-08-06 LAB — LIPID PANEL
Chol/HDL Ratio: 4.2 ratio (ref 0.0–5.0)
Cholesterol, Total: 183 mg/dL (ref 100–199)
HDL: 44 mg/dL (ref >39)
LDL Chol Calc (NIH): 112 mg/dL — ABNORMAL HIGH (ref 0–99)
Triglycerides: 150 mg/dL — ABNORMAL HIGH (ref 0–149)
VLDL Cholesterol Cal: 27 mg/dL (ref 5–40)

## 2020-08-06 LAB — PSA: Prostate Specific Ag, Serum: 0.5 ng/mL (ref 0.0–4.0)

## 2020-08-06 LAB — VITAMIN D 25 HYDROXY (VIT D DEFICIENCY, FRACTURES): Vit D, 25-Hydroxy: 21.3 ng/mL — ABNORMAL LOW (ref 30.0–100.0)

## 2020-08-06 LAB — VITAMIN B12: Vitamin B-12: 759 pg/mL (ref 232–1245)

## 2020-08-06 LAB — TSH: TSH: 2.18 u[IU]/mL (ref 0.450–4.500)

## 2020-08-20 ENCOUNTER — Telehealth: Payer: Self-pay | Admitting: Clinical

## 2020-08-20 NOTE — Telephone Encounter (Signed)
Integrated Behavioral Health General Follow Up Note  08/20/2020 Name: David Owen MRN: [id number] DOB: 01-Dec-1960 David Owen is a 60 y.o. year old male who sees David Glatter, FNP for primary care. LCSW was initially consulted to assist the patient with Financial Difficulties related to lack of health insurance and low income.  Interpreter: Yes.     Interpreter Name & Language: David Owen, [place] [phone]  Assessment: Patient experiencing financial difficulties related to lack of health insurance and low income. Patient and wife were referred to [name] by [place]. Patient previously had temporary Medicaid but this coverage has expired.  Ongoing Intervention: Today CSW called patient to follow up on referral to Legal [employer] of [place] [facility]) for assistance enrolling in [employer] health coverage. Patient reported he has not heard from David Owen yet and his wife has not received a call either. CSW following up with [facility] regarding this referral.  Review of patient status, including review of consultants reports, relevant laboratory and other test results, and collaboration with appropriate care team members and the patient's provider was performed as part of comprehensive patient evaluation and provision of services.     Follow up Plan: 1. Follow up with David Owen 2. CSW available as needed from clinic  David Owen, [facility] [phone]

## 2020-08-23 ENCOUNTER — Telehealth: Payer: Self-pay

## 2020-08-23 NOTE — Telephone Encounter (Signed)
I tried contacting patient today via a [place] interpreter to request that he provide proof of income to complete the [employer] PAP process.  Enrollment is incomplete until proof of income is received.  His mailbox has not been set up yet according to message from the phone # we have on file [phone].

## 2020-09-03 ENCOUNTER — Telehealth: Payer: Self-pay | Admitting: Clinical

## 2020-09-03 NOTE — Telephone Encounter (Signed)
Integrated Behavioral Health General Follow Up Note  09/03/2020 Name: David Owen MRN: [id number] DOB: 1960-09-15 Cheree Ditto Sjogren is a 60 y.o. year old male who sees Azzie Glatter, FNP for primary care. LCSW was initially consulted to assist the patient with Financial Difficulties related to lack of health insurance and low income.  This was an unsuccessful contact.   Interpreter: Yes.     Interpreter Name & Language: [name], [place] [phone]  Assessment: Patient experiencing financial difficulties related to lack of health insurance and low income. Patient and wife were referred to Patient [facility]) by [place]. Patient previously had temporary Medicaid but this coverage has expired.  Ongoing Intervention: Patient and wife, Alvino Blood are both patients of the [facility]. Patient's wife was here for appointment recently and had questions about her health insurance. CSW unavailable to meet with her at that time. Her listed phone number is the same as patient's. After last contact with patient 08/20/20, consulted with [facility]) regarding previous referral for assistance enrolling in [employer] health coverage during the special enrollment period. [facility] contact indicated a Dietitian has reached out several times to the patient and wife and had not received a call back.   CSW called patient with interpreter on 08/29/20. Interpreter spoke briefly with patient who stated he was at the pharmacy and requested a call back at another time. Called back later that day and no answer, no VM available. Called 4/18, no answer, no VM available. Called again today and no answer, no VM available. CSW also called patient and wife's daughter, Benedicte, and her phone went straight to VM but VM was not available. CSW available from clinic as needed if patient or wife reach out for assistance.   Review of patient status, including review of consultants  reports, relevant laboratory and other test results, and collaboration with appropriate care team members and the patient's provider was performed as part of comprehensive patient evaluation and provision of services.     Follow up Plan: 1. CSW available as needed from clinic  [name], [facility] [phone]

## 2020-09-10 ENCOUNTER — Ambulatory Visit: Payer: Self-pay | Admitting: Cardiovascular Disease

## 2020-10-31 ENCOUNTER — Other Ambulatory Visit: Payer: Self-pay

## 2020-11-06 ENCOUNTER — Ambulatory Visit: Payer: Self-pay | Admitting: Nurse Practitioner

## 2020-11-07 ENCOUNTER — Telehealth: Payer: Self-pay | Admitting: Clinical

## 2020-11-07 NOTE — Telephone Encounter (Signed)
Integrated Behavioral Health General Follow Up Note  11/07/2020 Name: David Owen MRN: [id number] DOB: [birth year] [name] is a 60 y.o. year old male who sees Azzie Glatter, FNP (Inactive) for primary care. LCSW was initially consulted to assist the patient with Financial Difficulties related to lack of health insurance and low income.  Interpreter: Yes.     Interpreter Name & Language: [employer], [place] [phone]  Assessment: Patient experiencing financial difficulties related to lack of health insurance and low income.  Ongoing Intervention: CSW received voicemail from patient's daughter requesting assistance in applying for health insurance. Several weeks back, CSW was unable to reach patient by phone regarding this. Have not been able to reach daughter back following her voicemail. Today CSW did reach patient with interpreter via phone. Patient indicated his caseworker at [employer] ([place]) is assisting them with [employer]. He provided consent for CSW to coordinate with caseworker for support on this. Church Engineer, civil (consulting) confirmed she is in the process of helping patient and his wife submit [employer].  Review of patient status, including review of consultants reports, relevant laboratory and other test results, and collaboration with appropriate care team members and the patient's provider was performed as part of comprehensive patient evaluation and provision of services.    [name], [facility] [phone]

## 2021-02-13 ENCOUNTER — Other Ambulatory Visit: Payer: Self-pay

## 2021-02-13 MED FILL — Losartan Potassium Tab 25 MG: ORAL | 30 days supply | Qty: 30 | Fill #0 | Status: AC

## 2021-02-13 MED FILL — Hydrochlorothiazide Tab 25 MG: ORAL | 30 days supply | Qty: 30 | Fill #0 | Status: AC

## 2021-03-28 ENCOUNTER — Encounter: Payer: Self-pay | Admitting: Nurse Practitioner

## 2021-03-28 ENCOUNTER — Other Ambulatory Visit: Payer: Self-pay

## 2021-03-28 ENCOUNTER — Ambulatory Visit (INDEPENDENT_AMBULATORY_CARE_PROVIDER_SITE_OTHER): Payer: Self-pay | Admitting: Nurse Practitioner

## 2021-03-28 VITALS — BP 169/91 | HR 84 | Temp 98.6°F | Ht 65.0 in | Wt 178.0 lb

## 2021-03-28 DIAGNOSIS — Z23 Encounter for immunization: Secondary | ICD-10-CM

## 2021-03-28 DIAGNOSIS — Z0000 Encounter for general adult medical examination without abnormal findings: Secondary | ICD-10-CM

## 2021-03-28 DIAGNOSIS — K649 Unspecified hemorrhoids: Secondary | ICD-10-CM

## 2021-03-28 DIAGNOSIS — Z9109 Other allergy status, other than to drugs and biological substances: Secondary | ICD-10-CM

## 2021-03-28 DIAGNOSIS — I10 Essential (primary) hypertension: Secondary | ICD-10-CM

## 2021-03-28 DIAGNOSIS — M79641 Pain in right hand: Secondary | ICD-10-CM

## 2021-03-28 DIAGNOSIS — E782 Mixed hyperlipidemia: Secondary | ICD-10-CM

## 2021-03-28 DIAGNOSIS — M79642 Pain in left hand: Secondary | ICD-10-CM

## 2021-03-28 LAB — POCT URINALYSIS DIP (CLINITEK)
Bilirubin, UA: NEGATIVE
Blood, UA: NEGATIVE
Glucose, UA: NEGATIVE mg/dL
Ketones, POC UA: NEGATIVE mg/dL
Leukocytes, UA: NEGATIVE
Nitrite, UA: NEGATIVE
POC PROTEIN,UA: 30 — AB
Spec Grav, UA: 1.025 (ref 1.010–1.025)
Urobilinogen, UA: 0.2 E.U./dL
pH, UA: 6 (ref 5.0–8.0)

## 2021-03-28 MED ORDER — HYDROCORTISONE ACETATE 25 MG RE SUPP
25.0000 mg | Freq: Two times a day (BID) | RECTAL | 0 refills | Status: DC
  Filled 2021-03-28: qty 12, 6d supply, fill #0

## 2021-03-28 MED ORDER — OLOPATADINE HCL 0.1 % OP SOLN
1.0000 [drp] | Freq: Two times a day (BID) | OPHTHALMIC | 12 refills | Status: DC
  Filled 2021-03-28: qty 5, 20d supply, fill #0

## 2021-03-28 MED ORDER — FEXOFENADINE HCL 180 MG PO TABS
180.0000 mg | ORAL_TABLET | Freq: Every day | ORAL | 3 refills | Status: DC
  Filled 2021-03-28: qty 90, 90d supply, fill #0

## 2021-03-28 NOTE — Progress Notes (Signed)
[place], [place]  [address] Phone:  [phone]   Fax:  [phone]    Established Patient Office Visit  Subjective:  Patient ID: David Owen, male    DOB: 07/12/1960  Age: 60 y.o. MRN: [id number]  CC:  Chief Complaint  Patient presents with   Follow-up    Pt is here for his follow up visit. Pt states that his hemorrhoids have been flaring up x 2 weeks. Pt also states that he has been spitting up blood x 4 to 5 months. Pt states that it started after he started working 12 hrs a day. Pt has been taking his blood pressure medications every 2 days.        HPI David Owen presents for follow up. He  has a past medical history of Acid reflux, Chronic ischemic heart disease (01/2020), Chronic kidney disease, Constipation, Hemorrhoids, Hyperlipidemia, Hypertension, and Sinus tachycardia by electrocardiogram (01/2020).   He is using ginger OTC for his hemorrhoids. He describes this as a root. He reports that this treatment burns as "chili". He does have bleeding. He describes this as bright red blood that is on  He reports that he forces it when he has the bleeding He does have nocturia 3-4 times. This has started to bother him. He feels like this is due to his taking the BP medication; HCTZ.  He is concern that his BP is high. He is having the blood in his sputum. He reports headaches. He described it as streaks with sputum. He states that it is not a lot. He admits that he does eye irration along with some nasal congestion. He has chills of the legs and arms often. He does have some tingling. He denies any numbness or weakness. Denies headache, dizziness, visual changes, shortness of breath, dyspnea on exertion, chest pain, nausea, vomiting or any edema.   He has pain in his hands in the fingers. He has been seen by a specialist in the past.  He has cramping in the hands with pain and swelling. He has not tried gloves at night. He  was unable to wear due to this been a bother. He does a lot of repetitive movements with his hands.   He is not taking medication daily as directed; amlodipine 10 mg HCTZ 25 mg  and Losartan 25 mg along with atrovastain . This is due to his schedule.  Past Medical History:  Diagnosis Date   Acid reflux    Chronic ischemic heart disease 01/2020   Chronic kidney disease    stage III   Constipation    on miralax daily    Hemorrhoids    Hyperlipidemia    Hypertension    Sinus tachycardia by electrocardiogram 01/2020    Past Surgical History:  Procedure Laterality Date   COLONOSCOPY  01/2020   He will repeat colonoscopy in 7 years   MULTIPLE TOOTH EXTRACTIONS     NO PAST SURGERIES      Family History  Problem Relation Age of Onset   Colon cancer Neg Hx    Colon polyps Neg Hx    Esophageal cancer Neg Hx    Rectal cancer Neg Hx    Stomach cancer Neg Hx     Social History   Socioeconomic History   Marital status: Married    Spouse name: Not on file   Number of children: Not on file   Years of education: Not on file  Highest education level: Not on file  Occupational History   Not on file  Tobacco Use   Smoking status: Never   Smokeless tobacco: Never  Vaping Use   Vaping Use: Never used  Substance and Sexual Activity   Alcohol use: Never   Drug use: Never   Sexual activity: Yes  Other Topics Concern   Not on file  Social History Narrative   Not on file   Social Determinants of Health   Financial Resource Strain: Not on file  Food Insecurity: Not on file  Transportation Needs: Not on file  Physical Activity: Not on file  Stress: Not on file  Social Connections: Not on file  Intimate Partner Violence: Not on file    Outpatient Medications Prior to Visit  Medication Sig Dispense Refill   acetaminophen (TYLENOL 8 HOUR) 650 MG CR tablet Take 1 tablet (650 mg total) by mouth every 8 (eight) hours as needed for pain. 60 tablet 1   amLODipine (NORVASC) 10 MG  tablet Take 1 tablet (10 mg total) by mouth at bedtime. 90 tablet 3   amLODipine (NORVASC) 10 MG tablet TAKE 1 TABLET (10 MG TOTAL) BY MOUTH AT BEDTIME. 90 tablet 3   atorvastatin (LIPITOR) 20 MG tablet Take 1 tablet (20 mg total) by mouth daily. 90 tablet 3   atorvastatin (LIPITOR) 20 MG tablet TAKE 1 TABLET (20 MG TOTAL) BY MOUTH DAILY. 90 tablet 3   hydrochlorothiazide (HYDRODIURIL) 25 MG tablet Take 1 tablet (25 mg total) by mouth daily. 90 tablet 3   hydrochlorothiazide (HYDRODIURIL) 25 MG tablet TAKE 1 TABLET (25 MG TOTAL) BY MOUTH DAILY. 90 tablet 3   losartan (COZAAR) 25 MG tablet Take 1 tablet (25 mg total) by mouth at bedtime. 90 tablet 3   cycloSPORINE (RESTASIS) 0.05 % ophthalmic emulsion Place 1 drop into both eyes 2 (two) times daily. (Patient not taking: Reported on 03/28/2021) 0.4 mL 1   cycloSPORINE (RESTASIS) 0.05 % ophthalmic emulsion PLACE 1 DROP INTO BOTH EYES 2 (TWO) TIMES DAILY. (Patient not taking: Reported on 03/28/2021) 60 each 1   losartan (COZAAR) 25 MG tablet TAKE 1 TABLET (25 MG TOTAL) BY MOUTH AT BEDTIME. 90 tablet 3   sildenafil (VIAGRA) 50 MG tablet Take 1 tablet (50 mg total) by mouth daily as needed for erectile dysfunction. 10 tablet 1   sildenafil (VIAGRA) 50 MG tablet TAKE 1 TABLET (50 MG TOTAL) BY MOUTH DAILY AS NEEDED FOR ERECTILE DYSFUNCTION. 10 tablet 1   No facility-administered medications prior to visit.    No Known Allergies  ROS Review of Systems    Objective:    Physical Exam Constitutional:      Appearance: He is obese.  HENT:     Head: Normocephalic.     Right Ear: Tympanic membrane normal.     Left Ear: Tympanic membrane normal.     Nose: Nose normal.     Mouth/Throat:     Mouth: Mucous membranes are moist.  Eyes:     Comments: Birthmark to eye  Cardiovascular:     Rate and Rhythm: Normal rate and regular rhythm.     Pulses: Normal pulses.     Heart sounds: Normal heart sounds.  Pulmonary:     Effort: Pulmonary effort is  normal.     Breath sounds: Normal breath sounds.  Abdominal:     Palpations: Abdomen is soft.     Comments: Increased abdominal girth hypoactive  Musculoskeletal:        General:  Swelling (bilateral) present. Normal range of motion.     Cervical back: Normal range of motion.  Skin:    General: Skin is warm and dry.     Capillary Refill: Capillary refill takes less than 2 seconds.  Neurological:     General: No focal deficit present.     Mental Status: He is alert and oriented to person, place, and time.  Psychiatric:        Mood and Affect: Mood normal.        Behavior: Behavior normal.        Thought Content: Thought content normal.        Judgment: Judgment normal.    BP (!) 169/91 Comment: manually  Pulse 84   Temp 98.6 F (37 C)   Ht _0  (1.651 m)   Wt 178 lb (80.7 kg)   SpO2 97%   BMI 29.62 kg/m  Wt Readings from Last 3 Encounters:  03/28/21 178 lb (80.7 kg)  08/05/20 181 lb (82.1 kg)  07/01/20 185 lb 3.2 oz (84 kg)     Health Maintenance Due  Topic Date Due   Zoster Vaccines- Shingrix (1 of 2) Never done   INFLUENZA VACCINE  12/16/2020    There are no preventive care reminders to display for this patient.  Lab Results  Component Value Date   TSH 2.180 08/05/2020   Lab Results  Component Value Date   WBC 5.1 08/05/2020   HGB 15.9 08/05/2020   HCT 49.6 08/05/2020   MCV 87 08/05/2020   PLT 220 08/05/2020   Lab Results  Component Value Date   NA 145 (H) 08/05/2020   K 4.3 08/05/2020   CO2 25 08/05/2020   GLUCOSE 114 (H) 08/05/2020   BUN 12 08/05/2020   CREATININE 1.19 08/05/2020   BILITOT 0.3 08/05/2020   ALKPHOS 103 08/05/2020   AST 22 08/05/2020   ALT 26 08/05/2020   PROT 7.8 08/05/2020   ALBUMIN 4.9 08/05/2020   CALCIUM 10.2 08/05/2020   ANIONGAP 11 02/15/2020   EGFR 70 08/05/2020   Lab Results  Component Value Date   CHOL 183 08/05/2020   Lab Results  Component Value Date   HDL 44 08/05/2020   Lab Results  Component Value  Date   LDLCALC 112 (H) 08/05/2020   Lab Results  Component Value Date   TRIG 150 (H) 08/05/2020   Lab Results  Component Value Date   CHOLHDL 4.2 08/05/2020   No results found for: HGBA1C    Assessment & Plan:   Problem List Items Addressed This Visit       Cardiovascular and Mediastinum   Essential hypertension - Primary Persistent  Encouraged on going compliance with current medication regimen Encouraged home monitoring and recording BP <130/80 Eating a heart-healthy diet with less salt Encouraged regular physical activity  Recommend Weight loss      Relevant Orders   Comp. Metabolic Panel (12)     Other   Mixed hyperlipidemia Stable  Reevaluation pending  Encouraged on going compliance with current medication regimen Eating a heart-healthy diet with less salt Encouraged regular physical activity  Recommend Weight loss   Relevant Orders   Lipid panel   Bilateral hand pain Persistent    Relevant Orders   Sedimentation rate   Other Visit Diagnoses     Healthcare maintenance       Relevant Orders   POCT URINALYSIS DIP (CLINITEK) (Completed)   Needs flu shot       Relevant  Orders   Flu Vaccine QUAD 16moIM (Fluarix, Fluzone & Alfiuria Quad PF)   Hemorrhoids, unspecified hemorrhoid type    Meds ordered this encounter  Medications   hydrocortisone (ANUSOL-HC) 25 MG suppository    Sig: Place 1 suppository (25 mg total) rectally 2 (two) times daily.    Dispense:  12 suppository    Refill:  0    Order Specific Question:   Supervising Provider    Answer:   JTresa Garter[[[id number]]  olopatadine (PATANOL) 0.1 % ophthalmic solution    Sig: Place 1 drop into both eyes 2 (two) times daily.    Dispense:  5 mL    Refill:  12    Order Specific Question:   Supervising Provider    Answer:   JTresa Garter[[[id number]   Follow-up: Return in about 3 months (around 06/28/2021) for Follow up HTN [id number]    CVevelyn Francois NP

## 2021-03-28 NOTE — Patient Instructions (Addendum)
Hypertension chez l'adulte Hypertension, Adult Une tension artrielle leve (hypertension) se produit lorsque la force de General Electric sang dans les artres est trop forte. Les artres [place] vaisseaux sanguins qui [employer] sang du cour  The Northwestern [employer]. L'hypertension force le cour  fournir beaucoup plus d'efforts pour [employer] sang et Surveyor, minerals rtrcissement ou la raideur des artres. Une hypertension non traite ou non contrle peut provoquer une crise cardiaque, une insuffisance cardiaque, un accident vasculaire crbral, une maladie rnale et d'autres problmes. La mesure de la tension artrielle est donne par deux chiffres : un nombre suprieur sur un nombre infrieur. Dans l'idal, votre tension artrielle devrait tre infrieure  120/80. Le premier chiffre (le plus lev) correspond  la pression systolique. Il s'agit de la mesure de la pression dans les artres au moment o le cour se contracte. Le second chiffre (le plus bas) correspond  la pression diastolique. Il s'agit de la mesure de la pression dans les artres Reynolds American cour est au repos. Quelles sont les causes ? La cause exacte de cette affection est inconnue. Certaines affections entranent ou sont lies  une tension artrielle leve. Quels sont les facteurs qui augmentent le risque ? Certains facteurs de risque d'hypertension artrielle peuvent tre contrls. Les facteurs suivants peuvent augmenter le risque de dvelopper cette affection : Le fait de fumer. Un diabte sucr de type 2, un taux lev de cholestrol, ou les deux. Un manque d'activit physique. tre en surpoids. Un excs de graisses, de sucres, de calories, ou de sel (sodium) dans son alimentation. Une consommation excessive d'alcool. Certains facteurs de risque de tension artrielle leve peuvent tre difficiles ou Marketing executive. Parmi ces facteurs figurent : Une insuffisance rnale chronique. Des antcdents familiaux  d'hypertension. L'ge. Le risque augmente avec l'ge. L'origine ethnique. Les personnes d'origine afro-amricaine ont un risque plus lev d'tre touch par cette affection. Le sexe. Les Baxter International plus exposs que les femmes avant l'ge de 68 ans. Aprs 65 ans, les femmes deviennent plus exposes que les hommes. Une apne obstructive du sommeil. Le stress. Quels sont les signes ou symptmes ? Une tension artrielle leve peut ne causer aucun symptme. Une tension artrielle trs leve (crise hypertensive) peut causer : Des maux de tte. De l'anxit. Un essoufflement. Des saignements de nez. Des nauses et des vomissements. Un changement de la vision. De fortes douleurs dans la poitrine. Des convulsions. Comment se fait le diagnostic ? Le diagnostic de cette affection se fait en mesurant votre tension artrielle pendant que vous tes assis(e) avec votre bras pos sur une surface plane, vos pieds poss  plat sur le sol, vos jambes n'tant pas croises. La face arrire du dispositif de mesure de la tension artrielle est place Target Corporation la peau de votre bras au niveau de votre cour. Elle devrait tre Lear Corporation moins deux fois sur le mme bras. Certaines maladies Art gallery manager des valeurs diffrentes de la tension artrielle Dover Corporation bras droit et le bras gauche. Certains facteurs peuvent amener les mesures de la tension artrielle  tre infrieures ou suprieures  la normale pendant une courte dure : Lorsque votre tension artrielle est plus leve au cabinet du prestataire de soins de sant qu' la maison, on parle d'effet  blouse blanche . La plupart des Enterprise Products souffrent de cette affection n'ont pas besoin de mdicaments. Lorsque votre tension artrielle est plus leve  la maison qu'au cabinet du prestataire de soins de sant, on parle d'hypertension masque. La plupart des  personnes atteintes de cette affection pourront avoir besoin de mdicaments pour Apple Computer tension artrielle. Si votre tension artrielle est leve lors d'une visite ou si elle est normale mais accompagne d'autres facteurs de [place], il pourrait vous tre demand de : Revenir un autre jour pour eBay mesure de votre tension artrielle. Surveiller votre tension artrielle chez vous pendant une semaine ou plus. Si un diagnostic d'hypertension est pos, vous devrez peut-tre effectuer d'autres examens sanguins ou d'imagerie qui permettront  votre prestataire de soins de sant d'valuer votre risque global concernant d'autres affections. Comment cette affection est-elle traite ? Cette affection est traite en effectuant en adoptant un mode de vie sain, comme une alimentation saine, davantage d'exercice et [employer]mos Energy rduction de sa consommation d'alcool. Votre prestataire de soins de sant pourra vous prescrire des mdicaments si les changements que vous avez apports  votre mode de vie ne suffisent pas  contrler votre tension artrielle, et si : Votre pression systolique est suprieure  130. Votre pression diastolique est suprieure  80. Votre tension artrielle cible peut varier en fonction de votre tat de sant, votre ge et Hess Corporation. [place] les instructions suivantes  domicile : Alimentation et boissons  Adoptez une alimentation riche en fibres et en potassium, et pauvre en sodium, en sucre ajout et en matires grasses. Il existe un rgime alimentaire type appel DASH (Dietary Approaches to Stop Hypertension). Pour suivre ce rgime, procdez de la faon suivante : Mangez beaucoup de fruits et de lgumes frais. Essayez de remplir la moiti de votre assiette de fruits et de lgumes  chaque repas. Consommez des crales compltes, par [employer]M Company ptes de bl complet, du riz brun ou du pain complet. Remplissez environ un quart de votre assiette de crales compltes. Buvez ou mangez des EMCOR laitiers pauvres en matires grasses, par Northeast Utilities lait crm ou du  yaourt allg. vitez les coupes de viande grasses, les viandes transformes ou saumures, et la Corning Incorporated. Remplissez environ un quart de votre assiette de protines maigres, United Stationers poisson, du poulet sans la peau, des haricots secs, des oufs ou du tofu. vitez les aliments prpars ou transforms. Ceux-ci ont tendance  tre trs riches en sodium, en sucre ajout et en matires grasses. Rduisez votre consommation journalire de sodium. La plupart des personnes atteintes d'hypertension devraient consommer moins de 1 500 mg de sodium par jour. Ne buvez pas d'alcool si : Votre prestataire de soins de sant vous l'interdit. Vous tes enceinte, pourriez l'tre ou envisagez de commencer Berkshire Hathaway. Si vous consommez de l'alcool : Limitez votre consommation  : 0  1 verre par jour pour Pepco Holdings. 0  2 verres par jour pour Marathon Oil. [place]ann Ore conscience de la quantit d'alcool contenue dans votre verre. Aux .-U., un verre correspond  une bouteille de bire (355 ml [12 onces]),  un verre de vin (148 ml [5 onces]) ou  un verre  liqueur d'alcool fort (44 ml [1,5 once]). Mode de Conservation officer, nature l'aide de votre prestataire de soins de sant pour Nationwide Mutual Insurance un bon poids sant ou pour PPG Industries. Demandez-lui quel est le poids idal pour vous. Faites de l'exercice au moins 30 minutes par jour, la United Auto jours de la semaine. Vous pouvez, par exemple, Fish farm manager, nager ou faire du vlo. Ajoutez galement des exercices pour renforcer vos muscles (exercices de rsistance) dans votre routine hebdomadaire, par [facility] ou l'haltrophilie. Elesa Hacker de faire ces types d'exercices pendant 30 minutes au moins 3 jours  par semaine. N'utilisez pas de Sun Microsystems tabac ou de la nicotine, tels que les cigarettes, les cigarettes lectroniques et Musician. Si vous avez besoin d'aide pour arrter de fumer, demandez conseil  votre prestataire de soins de sant. Surveillez  votre tension artrielle chez vous comme recommand par votre prestataire de soins de sant. Rendez-vous  toutes vos visites de suivi, comme prvu par Sun Microsystems de soins de sant. C'est important. Mdicaments Prenez vos mdicaments en vente libre et sur ordonnance en suivant scrupuleusement les instructions de votre prestataire de soins de sant. [place] attentivement les instructions. Les Sears Holdings Corporation l'hypertension doivent tre pris selon les indications. Ne sautez pas de doses de vos mdicaments contre l'hypertension. Cela vous exposerait  des problmes et pourrait rendre les mdicaments moins efficaces. Demandez  votre prestataire de soins de sant quels sont les effets indsirables des Engineer, site. Prenez Special educational needs teacher de soins de sant si vous : Pensez avoir une raction aux mdicaments que vous prenez. Souffrez de maux de tte  rptitions (rcurrents). Avez des Lincoln National Corporation. Avez les chevilles gonfles. Avez des troubles de Research officer, political party. Vous devez consulter immdiatement si vous : Souffrez de maux de tte intenses ou si vous vous sentez dsorient(e). Ressentez une faiblesse ou un engourdissement inhabituels. Vous vanouissez. Ressentez de fortes douleurs au niveau de la poitrine ou de l'abdomen. Vomissez  plusieurs reprises. [place] des Programmer, applications. Rsum L'hypertension se produit lorsque la force de General Electric sang dans vos artres est trop forte. Si cette affection n'est pas contrle, elle peut vous exposer  de graves complications. Votre tension artrielle cible peut varier en fonction de votre tat de sant, votre ge et Hess Corporation. Chez la plupart des [place], une tension artrielle normale est infrieure  120/80. L'hypertension est traite au moyen de Clear Channel Communications mode de vie, de mdicaments, ou une combinaison des deux. Les Clear Channel Communications mode de vie comprennent la perte de [place], une alimentation saine et pauvre en  sodium, davantage d'activit physique et une consommation [employer] Group. Ces conseils et renseignements ne sauraient se substituer  l'avis mdical de votre prestataire de soins de sant. Par consquent, il est primordial de parler de toutes vos proccupations avec votre prestataire de soins de sant. Document Revised: 02/15/2018 Document Reviewed: 02/15/2018 Elsevier Patient Education  2022 [employer] Hemorrhoids Les hmorrodes sont des veines dilates autour et  l'intrieur du rectum ou de l'anus. Il existe deux types d'hmorrodes : Les hmorrodes internes. Elles apparaissent dans les veines qui sont juste  Social research officer, government. Elles peuvent s'extrioriser, s'irriter et Corning Incorporated. Les hmorrodes externes. Elles apparaissent dans les veines qui sont  l'extrieur de l'anus et peuvent tre ressenties comme un gonflement douloureux ou une masse dure prs de l'anus. La plupart des hmorrodes n'engendrent pas de problmes graves et peuvent tre prises en charge Land O'Lakes traitements  domicile, notamment avec des changements dans le rgime alimentaire et le mode de vie. Si les traitements  domicile ne soulagent pas les symptmes, des procdures pourront tre effectues pour rduire ou supprimer les hmorrodes. Quelles sont les causes ? Cette affection est cause par une pression accrue dans la rgion anale. Cette pression peut rsulter de divers facteurs, tels que : Une constipation. Une difficult  vacuer les selles. Des diarrhes. [place] Sayers. L'obsit. Le fait de rester assis(e) pendant de longues priodes. Le fait de Colgate-Palmolive charges lourdes ou d'effectuer d'autres activits qui demandent des efforts intenses. Le sexe anal. Le fait de  faire du vlo pendant une longue priode. Quels sont les signes ou symptmes ? Les symptmes de cette affection comprennent : Des douleurs. Des dmangeaisons ou des irritations anales. Des saignements  rectaux. Des fuites de selles (fces). Des tumfactions anales. Une ou plusieurs masses autour de l'anus. Comment se fait le diagnostic ? Cette affection peut souvent tre diagnostique par un examen visuel. D'autres examens ou tests peuvent galement tre effectus,  savoir : Un examen qui implique la palpation de la zone rectale avec une main gante (toucher rectal). Un examen du canal anal ralis  l'aide d'un petit tube (anuscope). Un test sanguin, si vous avez perdu une Berkshire Hathaway de sang. Un examen de l'intrieur du clon  l'aide d'un tube flexible dot d'une camra  son extrmit (sigmodoscopie ou coloscopie). Comment cette affection est-elle traite ? En gnral, cette affection peut tre traite  domicile. Cependant, certaines procdures peuvent tre ralises si les changements alimentaires, les changements de mode de vie et d'autres traitements  domicile ne soulagent pas vos symptmes. Ces procdures peuvent aider  rduire la taille des hmorrodes ou  les supprimer compltement. Certaines de ces procdures impliquent The Sherwin[employer]Williams. Les procdures courantes comprennent : La ligature lastique. Des bandes lastiques sont places  la base des hmorrodes pour Baker Hughes Incorporated la circulation du sang vers celles-ci. La sclrothrapie. Un mdicament est inject dans les hmorrodes pour les rtrcir. La coagulation par infrarouge. Un type d'nergie lumineuse est utilise pour liminer les hmorrodes. L'hmorrodectomie. Les hmorrodes sont retires par UnitedHealth et les veines qui les alimentent sont ligatures. L'hmorrodopexie avec agrafes. Le chirurgien agrafe la base de l'hmorrode  la paroi rectale. [place] les instructions suivantes  domicile : Alimentation et Harley-Davidson des aliments riches en fibres, tels que des crales compltes, des haricots secs, des noix, des fruits et des lgumes. Renseignez-vous auprs de Landscape architect de soins de sant pour savoir si  vous devriez prendre des Computer Sciences Corporation fibres ajoutes (supplmentation en fibres). Rduisez votre consommation de matires grasses. Vous pouvez y Astronomer des produits laitiers pauvres en matires grasses, en diminuant votre consommation de viande rouge et en vitant les aliments transforms. Buvez des quantits suffisantes de liquides pour garder votre urine jaune ple. Prise en charge de la douleur et des gonflements  Prenez des bains de sige chauds pendant 20 minutes, 3  4 fois par jour pour AmerisourceBergen Corporation et l'inconfort. Vous Aflac Incorporated baignoire ou en utilisant un bain de sige portable qui s'adapte sur les toilettes. Si le prestataire de soins de sant vous le recommande, appliquez de la Peabody Energy zone [place]. L'application de poches de Colgate Palmolive bains de sige peut tre Colon. Mettez de la Land O'Lakes un sac en plastique. Placez une serviette entre votre peau et le sac. Laissez la glace sur votre peau pendant 20 minutes, 2  3 fois par UnumProvident. Instructions gnrales Prenez vos mdicaments en vente libre et sur ordonnance en suivant scrupuleusement les instructions de votre prestataire de soins de sant. Deforest Hoyles des crmes ou des suppositoires mdicamenteux de la manire recommande. Pratiquez une activit physique de [place] rgulire. Demandez  votre prestataire de soins de sant quels types d'activits physiques sont les plus indiqus pour vous et la frquence  laquelle vous pouvez les pratiquer. En gnral, vous devriez faire de l'exercice physique de faon modre pendant au moins 30 minutes par jour la plupart des jours de la semaine (150 minutes par semaine). Cela peut inclure des AMR Corporation la  marche, le vlo ou le yoga. Allez aux toilettes ds que vous en prouvez le besoin. N'attendez pas. vitez de forcer pour Calpine Corporation. Gardez la rgion anale propre et sche. Utilisez du papier toilette humide ou des  serviettes humides aprs tre all(e)  la selle. Ne restez pas trop longtemps assis(e) sur les toilettes. Cela augmente l'accumulation de sang et Engineer, technical sales. Rendez-vous  toutes les visites de suivi prvues par Sun Microsystems de soins de sant. C'est important. Prenez Special educational needs teacher de soins de sant si vous avez : Des douleurs et un gonflement accrus qui ne sont pas contrls par un traitement ou des mdicaments. Des difficults  vacuer les selles ou si vous n'arrivez pas  aller  la selle. Des douleurs ou une inflammation en dehors de la zone des hmorrodes. Vous devez consulter immdiatement si vous avez : Un saignement du rectum non contrl. Rsum Les hmorrodes sont des veines dilates autour et  l'intrieur du rectum ou de l'anus. La plupart des hmorrodes peuvent tre prises en charge Land O'Lakes traitements  domicile, notamment avec des changements dans le rgime alimentaire et le mode de vie. Prendre des bains de sige chauds peut aider  soulager la douleur et l'inconfort. Dans les cas graves, des procdures ou une intervention chirurgicale peuvent tre ralises pour rduire ou retirer les hmorrodes. Ces conseils et renseignements ne sauraient se substituer  l'avis mdical de votre prestataire de soins de sant. Par consquent, il est primordial de parler de toutes vos proccupations avec votre prestataire de soins de sant. Document Revised: 12/10/2020 Document Reviewed: 12/10/2020 Elsevier Patient Education  [place] l'adulte Allergic Rhinitis, Adult La rhinite allergique est une raction allergique qui affecte la muqueuse  l'intrieur du [place]. La muqueuse est le tissu qui produit le mucus. Il existe deux types principaux de rhinite allergique : Saisonnire. Ce type est galement appel  rhume des foins  et survient uniquement pendant certaines saisons. Permanente. Ce type de rhinite peut se produire  n'importe quel  moment de l'anne. La rhinite allergique ne se transmet pas National Oilwell Varco. Cette affection peut tre lgre, modre ou grave. Elle peut apparatre  tout ge et Baxter International moment. Quelles sont les causes ? Cette affection est cause par des allergnes. Les Medco Health Solutions choses capables de causer une raction allergique. Les Unisys Corporation de la rhinite allergique saisonnire et ceux responsables de la rhinite allergique permanente ne sont pas forcment les mmes. La rhinite allergique saisonnire est dclenche par le pollen. Le pollen peut provenir de l'herbe, des arbres et des Air Products and Chemicals. La rhinite allergique Fortune Brands tre dclenche par : Norfolk Southern. Des protines contenues dans l'urine, la salive ou les squames d'un animal de compagnie. Les squames sont des cellules de peau mortes provenant d'un animal de compagnie. La fume, la moisissure ou les Electronic Data Systems. Quels sont les facteurs qui augmentent le risque ? Vous tes plus susceptible de souffrir de cette affection si vous avez des antcdents familiaux d'allergies ou d'autres affections associes aux allergies, notamment : Une conjonctivite allergique.Il s'agit d'une inflammation d'une partie des yeux American Standard Companies. De l'asthme. Cette affection touche les poumons et entrane des Programmer, applications. Une dermatite atopique ou un eczma. Il s'agit d'une inflammation de la peau  long terme (chronique). Des allergies alimentaires Quels sont les signes ou symptmes ? Les symptmes de cette affection incluent : Des ternuements ou une toux. Le nez bouch (congestion nasale), qui dmange ou  qui coule. Les yeux C.H. [employer] Worldwide ou qui pleurent. La sensation que du mucus coule au fond de la gorge (scrtions post-nasales). Des troubles du sommeil. Une fatigue. Des maux de tte. Des maux de gorge. Comment se fait le diagnostic ? Le diagnostic de cette affection pourra reposer sur  l'valuation de vos symptmes, vos antcdents mdicaux et un examen physique. Votre prestataire de soins de sant pourra vrifier si vous souffrez d'affections associes, comme : De l'asthme. Une conjonctivite. Il s'agit d'une inflammation oculaire cause par une infection (conjonctivite). Une otite. Une infection des voies respiratoires suprieures. Il s'agit d'une infection au niveau du [place], de la gorge ou des voies respiratoires suprieures. Vous passerez peut-tre [employer]are des examens destins  identifier quels sont les allergnes qui dclenchent vos symptmes. Il pourra notamment s'agir de tests cutans ou d'analyses de sang. Comment cette affection est-elle traite ? Il n'existe pas de cure pour cette affection, mais un traitement peut permettre de contrler les symptmes. Le traitement pourra consister  : Prendre des Land O'Lakes qui bloquent les symptmes d'allergie, comme des corticostrodes et des antihistaminiques. Certains mdicaments pourront tre administrs par injection, vaporisation nasale, ou sous forme de comprims. viter l'exposition  tout allergne. tre expos(e) de faon rpte  de petites quantits d'allergnes afin d'acqurir une dfense contre ceux-ci (immunothrapie). Ce traitement est administr si les autres traitement se sont avrs inefficaces. Il pourra inclure : Une dsensibilisation (les piqres contre les allergies). Ce traitement consiste  injecter des [employer] Peps[employer] de petites quantits d'allergnes. Une immunothrapie sublinguale. Ce traitement consiste  prendre sous la langue de petites doses d'un mdicament contenant un allergne. Si ces traitements ne sont pas efficaces, votre prestataire de soins de sant pourra vous prescrire des mdicaments plus rcents et plus puissants. [place] les instructions suivantes  domicile : viter les allergnes Dcouvrez ce qui provoque vos allergies et vitez ces allergnes. Voici quelques mesures simples  prendre  pour viter d'tre expos(e) aux allergnes : Si vous souffrez d'allergies permanentes : Remplacez la moquette par Fish farm manager bois, du carrelage ou un revtement en vinyle. La moquette est un nid  squames et  poussire. Ne fumez pas. Ne permettez pas qu'on fume chez vous. Changez les filtres de votre chauffage et de votre climatiseur au moins une fois par [facility]. Si vous avez des allergies saisonnires, prenez les mesures qui suivent pendant la saison des allergies : Gardez les fentres fermes autant que possible. Prvoyez les activits en extrieur lorsque le taux de pollen est le plus bas. Vrifiez le taux de pollens avant de prvoir des Doctor, general practice. Lorsque vous rentrez Customer service manager vous, Research scientist (life sciences) de vtements et prenez une douche avant de vous installer sur votre canap ou dans votre lit. Si vous avez Engineer, water de compagnie vivant sous votre toit qui produit des allergnes : Advice worker en sorte que l'animal de compagnie n'entre pas dans votre chambre. Shelda Altes et faites la poussire rgulirement. Instructions gnrales Prenez vos mdicaments en vente libre et sur ordonnance en suivant scrupuleusement les instructions de votre prestataire de soins de sant. Buvez des quantits suffisantes de liquides pour garder votre urine jaune ple. Rendez-vous  toutes vos visites de suivi prvues par Sun Microsystems de soins de sant. C'est important. Pour plus d'informations American Academy of Allergy, Asthma & Immunology (Acadmie amricaine de l'allergie, de l'asthme et de l'immunologie) : www.aaaai.org Prenez contact avec un prestataire de soins de sant si : Vous avez de la fivre. Vous avez une toux qui ne disparat pas. Vous entendez un son sifflant  lorsque vous respirez (respiration sifflante). Vos symptmes vous ralentissent ou vous empchent d'accomplir vos activits quotidiennes. Demandez immdiatement de l'aide si : Vous avez des difficults respiratoires. Ce symptme peut  tre le signe d'un problme grave et Firefighter. N'attendez pas de voir si le symptme disparat. Vous devez consulter immdiatement un mdecin. Appelez les Administrator, Civil Service locaux (911 aux [place]). Ne prenez Chief Technology Officer, faites-vous accompagner  l'hpital. Rsum La rhinite allergique peut tre gre en prenant des mdicaments conformment aux instructions et en vitant d'tre expos(e) aux allergnes. Si vous souffrez d'allergies saisonnires, gardez les fentres fermes autant que possible pendant la saison des allergies. Contactez votre prestataire de soins de sant si vous avez de la fivre ou une toux qui ne disparat pas. Ces conseils et renseignements ne sauraient se substituer  l'avis mdical de votre prestataire de soins de sant. Par consquent, il est primordial de parler de toutes vos proccupations avec votre prestataire de soins de sant. Document Revised: 06/09/2019 Document Reviewed: 06/09/2019 Elsevier Patient Education  2022 [employer].

## 2021-03-29 LAB — COMP. METABOLIC PANEL (12)
AST: 18 IU/L (ref 0–40)
Albumin/Globulin Ratio: 1.9 (ref 1.2–2.2)
Albumin: 4.8 g/dL (ref 3.8–4.9)
Alkaline Phosphatase: 100 IU/L (ref 44–121)
BUN/Creatinine Ratio: 15 (ref 10–24)
BUN: 17 mg/dL (ref 8–27)
Bilirubin Total: 0.3 mg/dL (ref 0.0–1.2)
Calcium: 10.1 mg/dL (ref 8.6–10.2)
Chloride: 105 mmol/L (ref 96–106)
Creatinine, Ser: 1.1 mg/dL (ref 0.76–1.27)
Globulin, Total: 2.5 g/dL (ref 1.5–4.5)
Glucose: 119 mg/dL — ABNORMAL HIGH (ref 70–99)
Potassium: 4.2 mmol/L (ref 3.5–5.2)
Sodium: 146 mmol/L — ABNORMAL HIGH (ref 134–144)
Total Protein: 7.3 g/dL (ref 6.0–8.5)
eGFR: 77 mL/min/{1.73_m2} (ref >59)

## 2021-03-29 LAB — SEDIMENTATION RATE: Sed Rate: 13 mm/hr (ref 0–30)

## 2021-03-29 LAB — LIPID PANEL
Chol/HDL Ratio: 4.2 ratio (ref 0.0–5.0)
Cholesterol, Total: 174 mg/dL (ref 100–199)
HDL: 41 mg/dL (ref >39)
LDL Chol Calc (NIH): 103 mg/dL — ABNORMAL HIGH (ref 0–99)
Triglycerides: 174 mg/dL — ABNORMAL HIGH (ref 0–149)
VLDL Cholesterol Cal: 30 mg/dL (ref 5–40)

## 2021-04-14 ENCOUNTER — Other Ambulatory Visit: Payer: Self-pay

## 2021-04-14 ENCOUNTER — Other Ambulatory Visit: Payer: Self-pay | Admitting: Nurse Practitioner

## 2021-04-14 DIAGNOSIS — K649 Unspecified hemorrhoids: Secondary | ICD-10-CM

## 2021-04-14 MED ORDER — HYDROCORTISONE ACETATE 25 MG RE SUPP
25.0000 mg | Freq: Two times a day (BID) | RECTAL | 0 refills | Status: DC
  Filled 2021-04-14: qty 12, 6d supply, fill #0

## 2021-04-14 MED FILL — Losartan Potassium Tab 25 MG: ORAL | 30 days supply | Qty: 30 | Fill #1 | Status: AC

## 2021-04-14 MED FILL — Hydrochlorothiazide Tab 25 MG: ORAL | 30 days supply | Qty: 30 | Fill #1 | Status: AC

## 2021-04-15 ENCOUNTER — Other Ambulatory Visit: Payer: Self-pay

## 2021-05-19 ENCOUNTER — Other Ambulatory Visit: Payer: Self-pay

## 2021-05-19 MED FILL — Hydrochlorothiazide Tab 25 MG: ORAL | 30 days supply | Qty: 30 | Fill #2 | Status: AC

## 2021-05-20 ENCOUNTER — Other Ambulatory Visit: Payer: Self-pay | Admitting: Nurse Practitioner

## 2021-05-20 ENCOUNTER — Other Ambulatory Visit: Payer: Self-pay

## 2021-05-20 ENCOUNTER — Other Ambulatory Visit (HOSPITAL_COMMUNITY): Payer: Self-pay

## 2021-05-20 DIAGNOSIS — K649 Unspecified hemorrhoids: Secondary | ICD-10-CM

## 2021-05-21 ENCOUNTER — Other Ambulatory Visit (HOSPITAL_COMMUNITY): Payer: Self-pay

## 2021-05-21 MED ORDER — HYDROCORTISONE ACETATE 25 MG RE SUPP
25.0000 mg | Freq: Two times a day (BID) | RECTAL | 0 refills | Status: DC
  Filled 2021-05-21: qty 12, 6d supply, fill #0

## 2021-06-10 ENCOUNTER — Other Ambulatory Visit: Payer: Self-pay

## 2021-06-10 MED FILL — Losartan Potassium Tab 25 MG: ORAL | 30 days supply | Qty: 30 | Fill #0 | Status: AC

## 2021-06-10 MED FILL — Amlodipine Besylate Tab 10 MG (Base Equivalent): ORAL | 30 days supply | Qty: 30 | Fill #0 | Status: AC

## 2021-06-11 ENCOUNTER — Other Ambulatory Visit: Payer: Self-pay

## 2021-06-30 ENCOUNTER — Ambulatory Visit: Payer: Self-pay | Admitting: Nurse Practitioner

## 2021-07-05 IMAGING — CR DG CHEST 2V
2 series · 2 of 2 positions shown · non-contrast
Comparison: None.

CLINICAL DATA: Chest pain, cough for 1 month

EXAM:
CHEST - 2 VIEW

[chest pa]
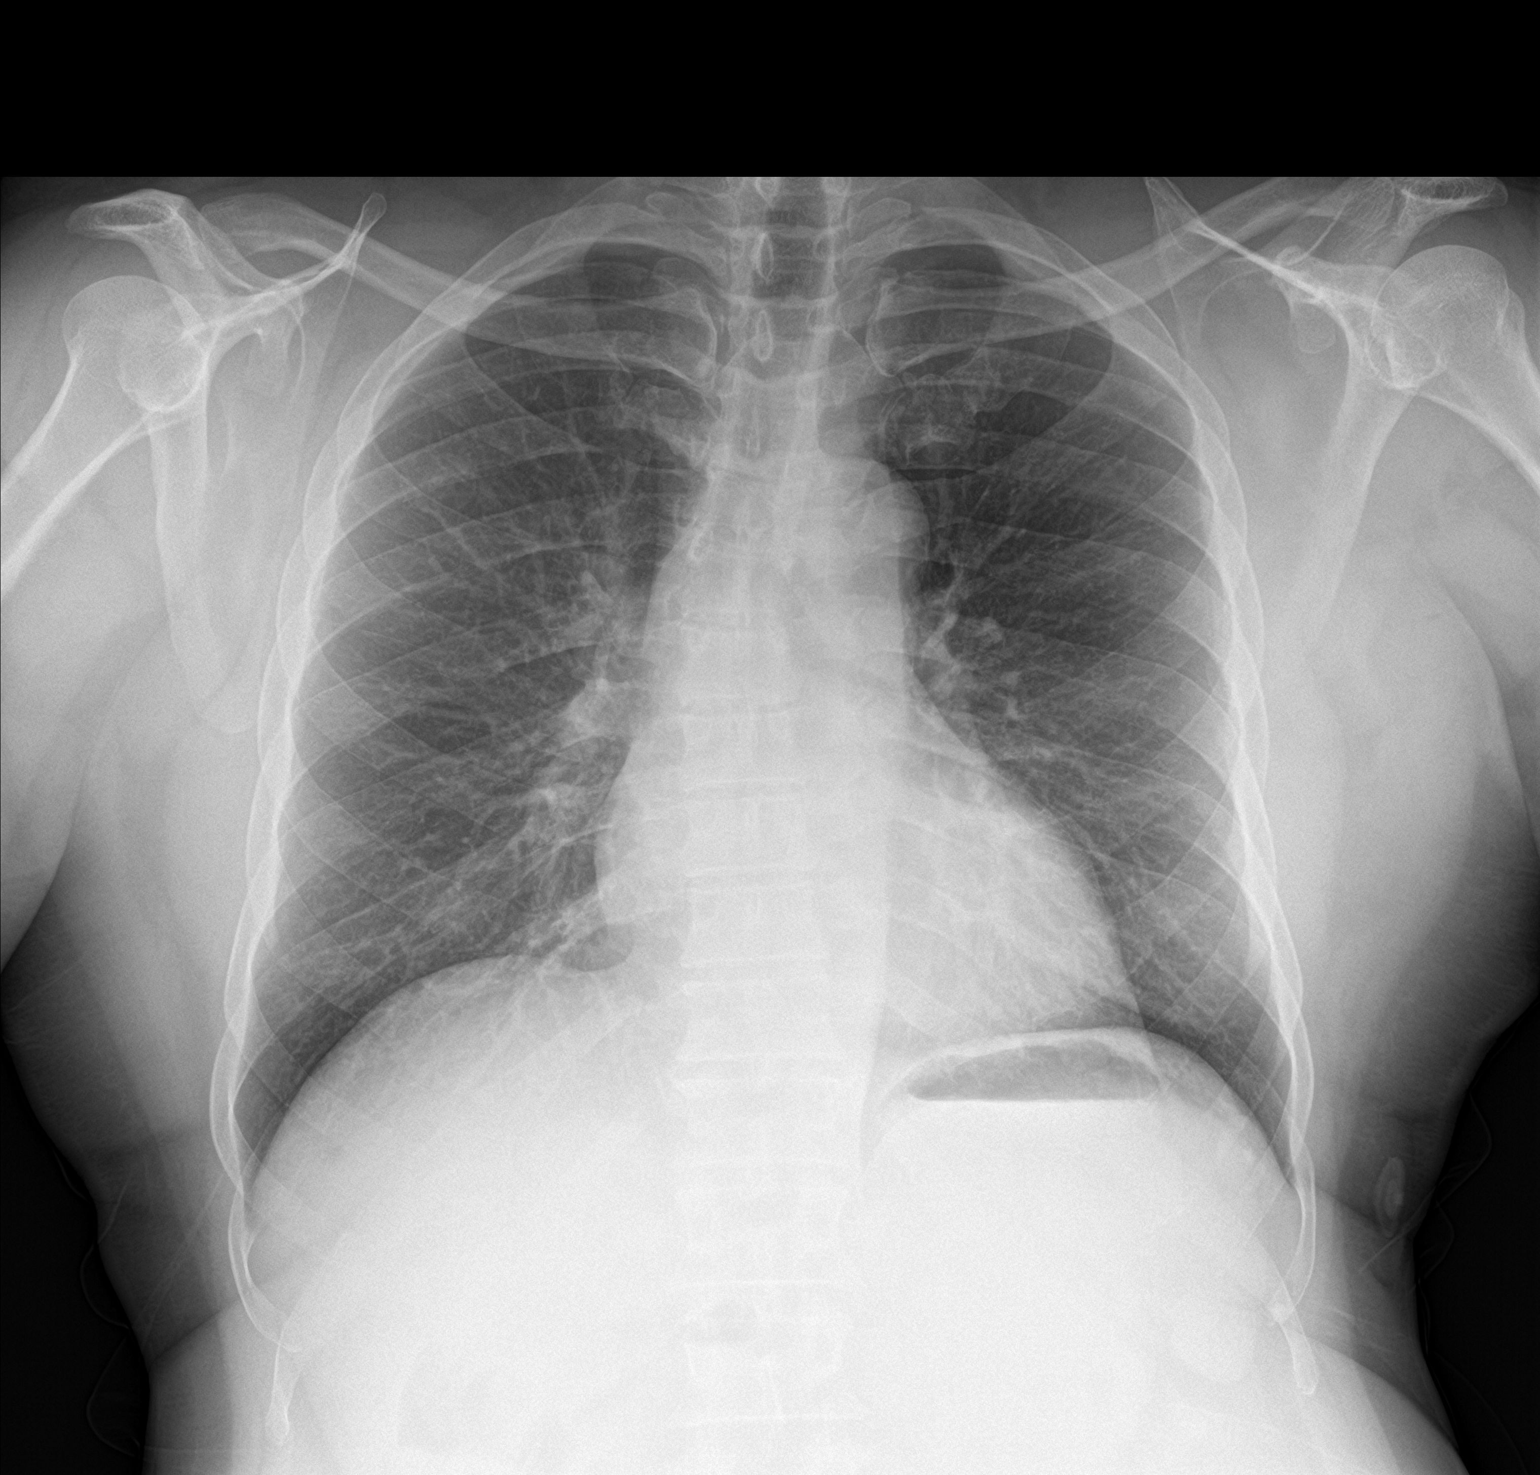

[chest lat]
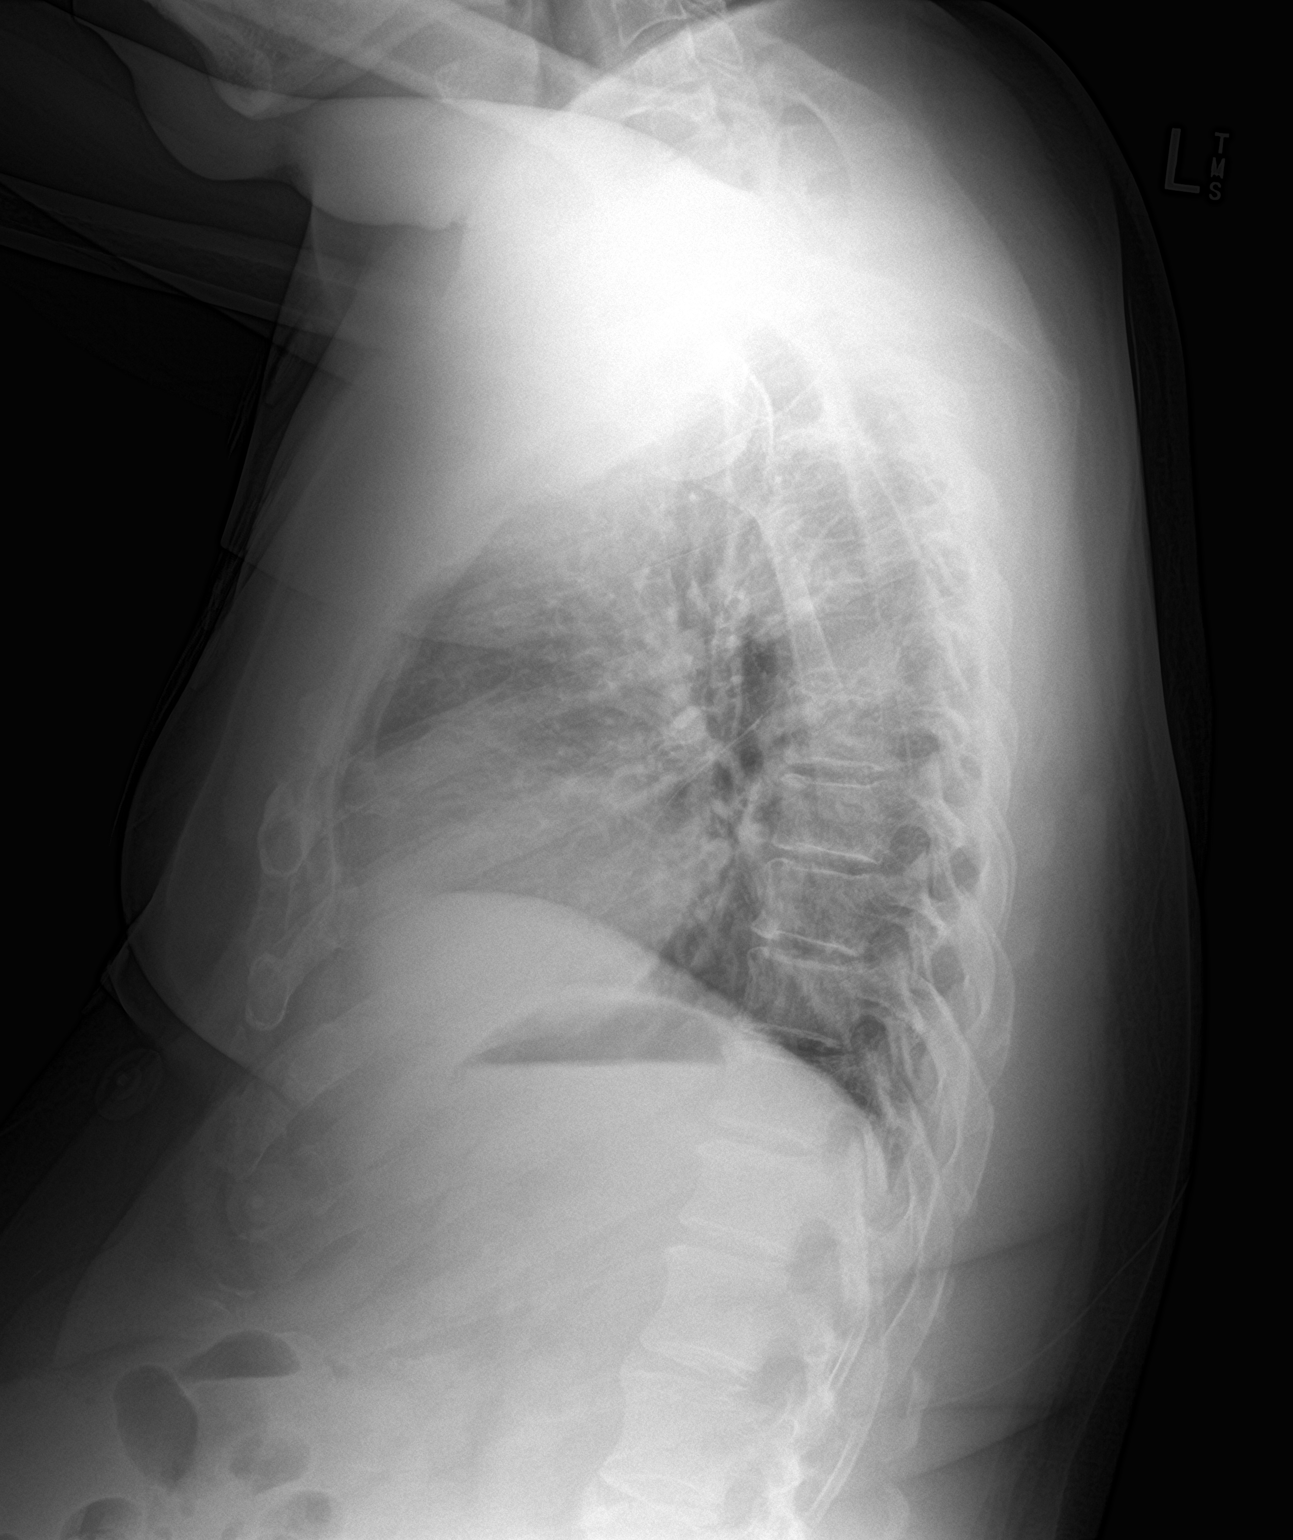

[2 of 2 positions shown; findings below may reference images not displayed]

FINDINGS: The heart size and mediastinal contours are within normal limits. No
focal airspace consolidation, pleural effusion, or pneumothorax. The
visualized skeletal structures are unremarkable.
IMPRESSION: No active cardiopulmonary disease.

## 2021-07-30 ENCOUNTER — Other Ambulatory Visit: Payer: Self-pay

## 2021-07-30 MED FILL — Hydrochlorothiazide Tab 25 MG: ORAL | 30 days supply | Qty: 30 | Fill #0 | Status: AC

## 2021-07-30 MED FILL — Losartan Potassium Tab 25 MG: ORAL | 30 days supply | Qty: 30 | Fill #1 | Status: AC

## 2021-07-30 MED FILL — Atorvastatin Calcium Tab 20 MG (Base Equivalent): ORAL | 30 days supply | Qty: 30 | Fill #0 | Status: AC

## 2021-07-30 MED FILL — Amlodipine Besylate Tab 10 MG (Base Equivalent): ORAL | 30 days supply | Qty: 30 | Fill #1 | Status: AC

## 2021-08-14 ENCOUNTER — Ambulatory Visit (INDEPENDENT_AMBULATORY_CARE_PROVIDER_SITE_OTHER): Payer: Self-pay | Admitting: Nurse Practitioner

## 2021-08-14 ENCOUNTER — Encounter: Payer: Self-pay | Admitting: Nurse Practitioner

## 2021-08-14 ENCOUNTER — Other Ambulatory Visit: Payer: Self-pay

## 2021-08-14 VITALS — BP 160/85 | HR 67 | Temp 98.3°F | Ht 65.0 in | Wt 179.4 lb

## 2021-08-14 DIAGNOSIS — H04203 Unspecified epiphora, bilateral lacrimal glands: Secondary | ICD-10-CM

## 2021-08-14 DIAGNOSIS — Z9109 Other allergy status, other than to drugs and biological substances: Secondary | ICD-10-CM

## 2021-08-14 DIAGNOSIS — R252 Cramp and spasm: Secondary | ICD-10-CM

## 2021-08-14 DIAGNOSIS — R058 Other specified cough: Secondary | ICD-10-CM

## 2021-08-14 DIAGNOSIS — T464X5A Adverse effect of angiotensin-converting-enzyme inhibitors, initial encounter: Secondary | ICD-10-CM

## 2021-08-14 DIAGNOSIS — I10 Essential (primary) hypertension: Secondary | ICD-10-CM

## 2021-08-14 DIAGNOSIS — E782 Mixed hyperlipidemia: Secondary | ICD-10-CM

## 2021-08-14 MED ORDER — LOSARTAN POTASSIUM 25 MG PO TABS
25.0000 mg | ORAL_TABLET | Freq: Every day | ORAL | 0 refills | Status: DC
  Filled 2021-08-14: qty 90, 90d supply, fill #0
  Filled 2021-09-12: qty 30, 30d supply, fill #0
  Filled 2021-10-24: qty 30, 30d supply, fill #1
  Filled 2021-12-16: qty 30, 30d supply, fill #2

## 2021-08-14 MED ORDER — ATORVASTATIN CALCIUM 20 MG PO TABS
20.0000 mg | ORAL_TABLET | Freq: Every day | ORAL | 0 refills | Status: DC
Start: 2021-08-14 — End: 2021-11-14
  Filled 2021-08-14: qty 90, 90d supply, fill #0
  Filled 2021-09-12: qty 30, 30d supply, fill #0
  Filled 2021-10-24: qty 30, 30d supply, fill #1

## 2021-08-14 MED ORDER — OLOPATADINE HCL 0.1 % OP SOLN
1.0000 [drp] | Freq: Two times a day (BID) | OPHTHALMIC | 12 refills | Status: DC
  Filled 2021-08-14: qty 5, 20d supply, fill #0

## 2021-08-14 MED ORDER — IBUPROFEN 600 MG PO TABS
600.0000 mg | ORAL_TABLET | Freq: Three times a day (TID) | ORAL | 0 refills | Status: DC | PRN
  Filled 2021-08-14: qty 30, 10d supply, fill #0

## 2021-08-14 MED ORDER — HYDROCHLOROTHIAZIDE 25 MG PO TABS
25.0000 mg | ORAL_TABLET | Freq: Every day | ORAL | 2 refills | Status: DC
  Filled 2021-08-14 – 2021-09-12 (×2): qty 30, 30d supply, fill #0
  Filled 2021-10-24: qty 30, 30d supply, fill #1
  Filled 2021-12-16: qty 30, 30d supply, fill #2

## 2021-08-14 MED ORDER — AMLODIPINE BESYLATE 10 MG PO TABS
10.0000 mg | ORAL_TABLET | Freq: Every day | ORAL | 2 refills | Status: DC
  Filled 2021-08-14 – 2021-09-12 (×2): qty 30, 30d supply, fill #0
  Filled 2021-10-24 (×2): qty 30, 30d supply, fill #1

## 2021-08-14 NOTE — Progress Notes (Signed)
_0  ID: David Owen, male    DOB: [birth year], 61 y.o.   MRN: [id number] ? ?Chief Complaint  ?Patient presents with  ? Follow-up  ?  Pt stated he has cramps in his fingers all the time  ? ? ?Referring provider: ?Vevelyn Francois, NP ? ? ?HPI ? ?Patient presents today for follow-up.  He states that he has not taken his blood pressure medicines in the past 2 days.  We discussed the importance of taking take his medication every day.  We discussed that his blood pressure is slightly elevated in office today.  He states that he will take his medicine today.  Patient does complain of cramps to bilateral hands and itchy watery eyes.  He does need a refill on his eyedrops. Denies f/c/s, n/v/d, hemoptysis, PND, chest pain or edema. ? ? ? ? ? ? ?No Known Allergies ? ?Immunization History  ?Administered Date(s) Administered  ? Hepatitis A, Adult 04/04/2020  ? Hepatitis B 01/24/2019  ? Influenza,inj,Quad PF,6+ Mos 04/04/2020, 04/29/2020, 03/28/2021  ? MMR 01/24/2019  ? PFIZER(Purple Top)SARS-COV-2 Vaccination 01/25/2019, 02/29/2020  ? Td 01/24/2019  ? ? ?Past Medical History:  ?Diagnosis Date  ? Acid reflux   ? Chronic ischemic heart disease 01/2020  ? Chronic kidney disease   ? stage III  ? Constipation   ? on miralax daily   ? Hemorrhoids   ? Hyperlipidemia   ? Hypertension   ? Sinus tachycardia by electrocardiogram 01/2020  ? ? ?Tobacco History: ?Social History  ? ?Tobacco Use  ?Smoking Status Never  ?Smokeless Tobacco Never  ? ?Counseling given: Not Answered ? ? ?Outpatient Encounter Medications as of 08/14/2021  ?Medication Sig  ? acetaminophen (TYLENOL 8 HOUR) 650 MG CR tablet Take 1 tablet (650 mg total) by mouth every 8 (eight) hours as needed for pain.  ? amLODipine (NORVASC) 10 MG tablet Take 1 tablet (10 mg total) by mouth at bedtime.  ? hydrochlorothiazide (HYDRODIURIL) 25 MG tablet TAKE 1 TABLET (25 MG TOTAL) BY MOUTH DAILY.  ? ibuprofen (ADVIL) 600 MG tablet Take 1 tablet (600 mg total) by mouth every 8  (eight) hours as needed.  ? olopatadine (PATANOL) 0.1 % ophthalmic solution Place 1 drop into both eyes 2 (two) times daily.  ? [DISCONTINUED] amLODipine (NORVASC) 10 MG tablet TAKE 1 TABLET (10 MG TOTAL) BY MOUTH AT BEDTIME.  ? [DISCONTINUED] atorvastatin (LIPITOR) 20 MG tablet Take 1 tablet (20 mg total) by mouth daily.  ? [DISCONTINUED] atorvastatin (LIPITOR) 20 MG tablet TAKE 1 TABLET (20 MG TOTAL) BY MOUTH DAILY.  ? [DISCONTINUED] ibuprofen (ADVIL) 600 MG tablet Take 1 tablet (600 mg total) by mouth every 8 (eight) hours as needed.  ? [DISCONTINUED] losartan (COZAAR) 25 MG tablet Take 1 tablet (25 mg total) by mouth at bedtime.  ? [DISCONTINUED] olopatadine (PATANOL) 0.1 % ophthalmic solution Place 1 drop into both eyes 2 (two) times daily.  ? amLODipine (NORVASC) 10 MG tablet TAKE 1 TABLET (10 MG TOTAL) BY MOUTH AT BEDTIME.  ? atorvastatin (LIPITOR) 20 MG tablet TAKE 1 TABLET (20 MG TOTAL) BY MOUTH DAILY.  ? cycloSPORINE (RESTASIS) 0.05 % ophthalmic emulsion Place 1 drop into both eyes 2 (two) times daily. (Patient not taking: Reported on 03/28/2021)  ? fexofenadine (ALLEGRA ALLERGY) 180 MG tablet Take 1 tablet (180 mg total) by mouth daily.  ? hydrochlorothiazide (HYDRODIURIL) 25 MG tablet Take 1 tablet (25 mg total) by mouth daily.  ? hydrocortisone (ANUSOL-HC) 25 MG suppository Place 1 suppository (25 mg  total) rectally 2 (two) times daily.  ? losartan (COZAAR) 25 MG tablet Take 1 tablet (25 mg total) by mouth at bedtime.  ? sildenafil (VIAGRA) 50 MG tablet Take 1 tablet (50 mg total) by mouth daily as needed for erectile dysfunction.  ? sildenafil (VIAGRA) 50 MG tablet TAKE 1 TABLET (50 MG TOTAL) BY MOUTH DAILY AS NEEDED FOR ERECTILE DYSFUNCTION.  ? [DISCONTINUED] hydrochlorothiazide (HYDRODIURIL) 25 MG tablet Take 1 tablet (25 mg total) by mouth daily.  ? [DISCONTINUED] losartan (COZAAR) 25 MG tablet TAKE 1 TABLET (25 MG TOTAL) BY MOUTH AT BEDTIME.  ? ?No facility-administered encounter medications on  file as of 08/14/2021.  ? ? ? ?Review of Systems ? ?Review of Systems  ?Constitutional: Negative.   ?HENT: Negative.    ?     Itchy, watery eyes  ?Cardiovascular: Negative.   ?Gastrointestinal: Negative.   ?Allergic/Immunologic: Negative.   ?Neurological: Negative.   ?Psychiatric/Behavioral: Negative.     ? ? ? ?Physical Exam ? ?BP (!) 160/85 (BP Location: Left Arm, Patient Position: Sitting, Cuff Size: Normal)   Pulse 67   Temp 98.3 ?F (36.8 ?C)   Ht _0  (1.651 m)   Wt 179 lb 6 oz (81.4 kg)   SpO2 100%   BMI 29.85 kg/m?  ? ?Wt Readings from Last 5 Encounters:  ?08/14/21 179 lb 6 oz (81.4 kg)  ?03/28/21 178 lb (80.7 kg)  ?08/05/20 181 lb (82.1 kg)  ?07/01/20 185 lb 3.2 oz (84 kg)  ?06/05/20 186 lb 2 oz (84.4 kg)  ? ? ? ?Physical Exam ?Vitals and nursing note reviewed.  ?Constitutional:   ?   General: He is not in acute distress. ?   Appearance: He is well-developed.  ?Cardiovascular:  ?   Rate and Rhythm: Normal rate and regular rhythm.  ?Pulmonary:  ?   Effort: Pulmonary effort is normal.  ?   Breath sounds: Normal breath sounds.  ?Skin: ?   General: Skin is warm and dry.  ?Neurological:  ?   Mental Status: He is alert and oriented to person, place, and time.  ? ? ? ?Lab Results: ? ?CBC ?   ?Component Value Date/Time  ? WBC 5.1 08/05/2020 1135  ? WBC 8.3 02/15/2020 1321  ? RBC 5.73 08/05/2020 1135  ? RBC 5.04 02/15/2020 1321  ? HGB 15.9 08/05/2020 1135  ? HCT 49.6 08/05/2020 1135  ? PLT 220 08/05/2020 1135  ? MCV 87 08/05/2020 1135  ? MCH 27.7 08/05/2020 1135  ? MCH 28.8 02/15/2020 1321  ? MCHC 32.1 08/05/2020 1135  ? MCHC 33.4 02/15/2020 1321  ? RDW 13.3 08/05/2020 1135  ? LYMPHSABS 1.8 08/05/2020 1135  ? EOSABS 0.2 08/05/2020 1135  ? BASOSABS 0.1 08/05/2020 1135  ? ? ?BMET ?   ?Component Value Date/Time  ? NA 146 (H) 03/28/2021 1424  ? K 4.2 03/28/2021 1424  ? CL 105 03/28/2021 1424  ? CO2 25 08/05/2020 1135  ? GLUCOSE 119 (H) 03/28/2021 1424  ? GLUCOSE 152 (H) 02/15/2020 1321  ? BUN 17 03/28/2021 1424  ?  CREATININE 1.10 03/28/2021 1424  ? CALCIUM 10.1 03/28/2021 1424  ? GFRNONAA 62 06/05/2020 1118  ? GFRAA 72 06/05/2020 1118  ? ? ? ?Assessment & Plan:  ? ?Essential hypertension ?- CBC ?- Comprehensive metabolic panel ?- hydrochlorothiazide (HYDRODIURIL) 25 MG tablet; Take 1 tablet (25 mg total) by mouth daily.  Dispense: 30 tablet; Refill: 2 ?- losartan (COZAAR) 25 MG tablet; Take 1 tablet (25 mg total) by mouth  at bedtime.  Dispense: 90 tablet; Refill: 0 ? ?2. Hand cramps ? ?- ibuprofen (ADVIL) 600 MG tablet; Take 1 tablet (600 mg total) by mouth every 8 (eight) hours as needed.  Dispense: 30 tablet; Refill: 0 ? ?3. Environmental allergies ? ?- olopatadine (PATANOL) 0.1 % ophthalmic solution; Place 1 drop into both eyes 2 (two) times daily.  Dispense: 5 mL; Refill: 12 ? ?4. Mixed hyperlipidemia ? ? ?5. Watery eyes ? ?- Ambulatory referral to Optometry ? ?6. ACE-inhibitor cough ? ?- losartan (COZAAR) 25 MG tablet; Take 1 tablet (25 mg total) by mouth at bedtime.  Dispense: 90 tablet; Refill: 0 ? ? ?Follow up: ? ?Follow up in 3 months or sooner if needed ? ? ? ? ?Fenton Foy, NP ?08/14/2021 ? ?

## 2021-08-14 NOTE — Assessment & Plan Note (Signed)
-   CBC ?- Comprehensive metabolic panel ?- hydrochlorothiazide (HYDRODIURIL) 25 MG tablet; Take 1 tablet (25 mg total) by mouth daily.  Dispense: 30 tablet; Refill: 2 ?- losartan (COZAAR) 25 MG tablet; Take 1 tablet (25 mg total) by mouth at bedtime.  Dispense: 90 tablet; Refill: 0 ? ?2. Hand cramps ? ?- ibuprofen (ADVIL) 600 MG tablet; Take 1 tablet (600 mg total) by mouth every 8 (eight) hours as needed.  Dispense: 30 tablet; Refill: 0 ? ?3. Environmental allergies ? ?- olopatadine (PATANOL) 0.1 % ophthalmic solution; Place 1 drop into both eyes 2 (two) times daily.  Dispense: 5 mL; Refill: 12 ? ?4. Mixed hyperlipidemia ? ? ?5. Watery eyes ? ?- Ambulatory referral to Optometry ? ?6. ACE-inhibitor cough ? ?- losartan (COZAAR) 25 MG tablet; Take 1 tablet (25 mg total) by mouth at bedtime.  Dispense: 90 tablet; Refill: 0 ? ? ?Follow up: ? ?Follow up in 3 months or sooner if needed ? ?

## 2021-08-14 NOTE — Patient Instructions (Signed)
1. Essential hypertension ? ?- CBC ?- Comprehensive metabolic panel ?- hydrochlorothiazide (HYDRODIURIL) 25 MG tablet; Take 1 tablet (25 mg total) by mouth daily.  Dispense: 30 tablet; Refill: 2 ?- losartan (COZAAR) 25 MG tablet; Take 1 tablet (25 mg total) by mouth at bedtime.  Dispense: 90 tablet; Refill: 0 ? ?2. Hand cramps ? ?- ibuprofen (ADVIL) 600 MG tablet; Take 1 tablet (600 mg total) by mouth every 8 (eight) hours as needed.  Dispense: 30 tablet; Refill: 0 ? ?3. Environmental allergies ? ?- olopatadine (PATANOL) 0.1 % ophthalmic solution; Place 1 drop into both eyes 2 (two) times daily.  Dispense: 5 mL; Refill: 12 ? ?4. Mixed hyperlipidemia ? ? ?5. Watery eyes ? ?- Ambulatory referral to Optometry ? ?6. ACE-inhibitor cough ? ?- losartan (COZAAR) 25 MG tablet; Take 1 tablet (25 mg total) by mouth at bedtime.  Dispense: 90 tablet; Refill: 0 ? ? ?Follow up: ? ?Follow up in 3 months or sooner if needed ? ?

## 2021-08-15 LAB — CBC
Hematocrit: 47.5 % (ref 37.5–51.0)
Hemoglobin: 15.7 g/dL (ref 13.0–17.7)
MCH: 28.7 pg (ref 26.6–33.0)
MCHC: 33.1 g/dL (ref 31.5–35.7)
MCV: 87 fL (ref 79–97)
Platelets: 176 10*3/uL (ref 150–450)
RBC: 5.47 x10E6/uL (ref 4.14–5.80)
RDW: 13.5 % (ref 11.6–15.4)
WBC: 5.9 10*3/uL (ref 3.4–10.8)

## 2021-08-15 LAB — COMPREHENSIVE METABOLIC PANEL
ALT: 30 IU/L (ref 0–44)
AST: 15 IU/L (ref 0–40)
Albumin/Globulin Ratio: 2.2 (ref 1.2–2.2)
Albumin: 4.7 g/dL (ref 3.8–4.8)
Alkaline Phosphatase: 98 IU/L (ref 44–121)
BUN/Creatinine Ratio: 14 (ref 10–24)
BUN: 16 mg/dL (ref 8–27)
Bilirubin Total: 0.3 mg/dL (ref 0.0–1.2)
CO2: 23 mmol/L (ref 20–29)
Calcium: 9.6 mg/dL (ref 8.6–10.2)
Chloride: 106 mmol/L (ref 96–106)
Creatinine, Ser: 1.16 mg/dL (ref 0.76–1.27)
Globulin, Total: 2.1 g/dL (ref 1.5–4.5)
Glucose: 104 mg/dL — ABNORMAL HIGH (ref 70–99)
Potassium: 3.8 mmol/L (ref 3.5–5.2)
Sodium: 143 mmol/L (ref 134–144)
Total Protein: 6.8 g/dL (ref 6.0–8.5)
eGFR: 72 mL/min/{1.73_m2} (ref >59)

## 2021-09-12 ENCOUNTER — Other Ambulatory Visit: Payer: Self-pay

## 2021-09-26 ENCOUNTER — Encounter (HOSPITAL_COMMUNITY): Payer: Self-pay

## 2021-09-26 ENCOUNTER — Ambulatory Visit (HOSPITAL_COMMUNITY): Payer: Self-pay

## 2021-09-26 ENCOUNTER — Other Ambulatory Visit: Payer: Self-pay

## 2021-09-26 ENCOUNTER — Ambulatory Visit (HOSPITAL_COMMUNITY)
Admission: EM | Admit: 2021-09-26 | Discharge: 2021-09-26 | Disposition: A | Discharge status: Home / Self Care | Payer: Self-pay | Attending: Internal Medicine | Admitting: Internal Medicine

## 2021-09-26 DIAGNOSIS — J069 Acute upper respiratory infection, unspecified: Secondary | ICD-10-CM

## 2021-09-26 DIAGNOSIS — K2100 Gastro-esophageal reflux disease with esophagitis, without bleeding: Secondary | ICD-10-CM

## 2021-09-26 DIAGNOSIS — R1012 Left upper quadrant pain: Secondary | ICD-10-CM

## 2021-09-26 DIAGNOSIS — R1013 Epigastric pain: Secondary | ICD-10-CM

## 2021-09-26 DIAGNOSIS — R072 Precordial pain: Secondary | ICD-10-CM

## 2021-09-26 LAB — POCT URINALYSIS DIPSTICK, ED / UC
Bilirubin Urine: NEGATIVE
Glucose, UA: NEGATIVE mg/dL
Ketones, ur: NEGATIVE mg/dL
Leukocytes,Ua: NEGATIVE
Nitrite: NEGATIVE
Protein, ur: 30 mg/dL — AB
Specific Gravity, Urine: 1.02 (ref 1.005–1.030)
Urobilinogen, UA: 0.2 mg/dL (ref 0.0–1.0)
pH: 6.5 (ref 5.0–8.0)

## 2021-09-26 MED ORDER — FLUTICASONE PROPIONATE 50 MCG/ACT NA SUSP
1.0000 | Freq: Every day | NASAL | 2 refills | Status: DC
  Filled 2021-09-26: qty 16, 30d supply, fill #0

## 2021-09-26 MED ORDER — ACETAMINOPHEN 500 MG PO TABS
1000.0000 mg | ORAL_TABLET | Freq: Four times a day (QID) | ORAL | 0 refills | Status: DC | PRN
  Filled 2021-09-26: qty 30, 4d supply, fill #0

## 2021-09-26 MED ORDER — CETIRIZINE HCL 10 MG PO TABS
10.0000 mg | ORAL_TABLET | Freq: Every day | ORAL | 2 refills | Status: DC
Start: 2021-09-26 — End: 2022-01-08
  Filled 2021-09-26: qty 30, 30d supply, fill #0

## 2021-09-26 MED ORDER — PANTOPRAZOLE SODIUM 40 MG PO TBEC
40.0000 mg | DELAYED_RELEASE_TABLET | Freq: Every day | ORAL | 1 refills | Status: DC
  Filled 2021-09-26: qty 30, 30d supply, fill #0
  Filled 2021-10-24: qty 30, 30d supply, fill #1

## 2021-09-26 NOTE — ED Provider Notes (Addendum)
[facility]    CSN: [id number] Arrival date & time: 09/26/21  1149      History   Chief Complaint Chief Complaint  Patient presents with   Cough    HPI David Owen is a 61 y.o. male.   Patient presents to urgent care for evaluation of cold symptoms, runny nose, cough, and headache. When he coughs, he states his chest hurts. Also complaining of thoracic back pain that started with his cough. Symptoms started 3-4 days ago. Denies dizziness, nausea, vomiting and abdominal pain. He also reports shortness of breath with activity. Reports fever/chills last night that "went away" around 6am this morning.  He did not measure his temperature at home. Cough is dry. Denies orthopnea. Denies sore throat and ear pain. Headache is localized to his bilateral forehead and he currently rates it at an 8 on a scale of 0-10. He took tylenol for headache last night and it helped a little bit. Currently having "heart pressure" and rates it at an 8 on scale of 0-10. Denies history of heart problems. States his "heart pressure" gets better when he drinks water. Reports esophageal burning for "a long time" but it comes and goes. He has taken medication for his esophageal burning in the past but cannot remember which one it was. Denies relation of pain to eating and states he gets this pain 2-3 times per week. Denies dysuria, but reports urinary frequency. Denies constipation and diarrhea. Denies any other aggravating or relieving factors.   Offered [place] interpreter for patient. Patient prefers for daughter to translate for him at time of visit.   Cough  Past Medical History:  Diagnosis Date   Acid reflux    Chronic ischemic heart disease 01/2020   Chronic kidney disease    stage III   Constipation    on miralax daily    Hemorrhoids    Hyperlipidemia    Hypertension    Sinus tachycardia by electrocardiogram 01/2020    Patient Active Problem List   Diagnosis Date Noted   Left Achilles  tendinitis 07/01/2020   Hypercalcemia 06/05/2020   Bilateral hand pain 04/30/2020   Carpal tunnel syndrome 04/29/2020   ACE-inhibitor cough 04/04/2020   Chronic cough 03/24/2020   Erectile dysfunction 02/28/2020   Dry eye syndrome of both eyes 12/18/2019   Mixed hyperlipidemia 11/28/2019   Essential hypertension 11/21/2019   History of syphilis 11/21/2019   Gastroesophageal reflux disease 11/21/2019   Chronic bilateral low back pain without sciatica 11/21/2019   Ref[facility]e health examination 11/19/2019    Past Surgical History:  Procedure Laterality Date   COLONOSCOPY  01/2020   He will repeat colonoscopy in 7 years   MULTIPLE TOOTH EXTRACTIONS     NO PAST SURGERIES         Home Medications    Prior to Admission medications   Medication Sig Start Date End Date Taking? Authorizing Provider  acetaminophen (TYLENOL) 500 MG tablet Take 2 tablets (1,000 mg total) by mouth every 6 (six) hours as needed. 09/26/21  Yes Carlisle Beers, FNP  cetirizine (ZYRTEC) 10 MG tablet Take 1 tablet (10 mg total) by mouth daily. 09/26/21  Yes Carlisle Beers, FNP  fluticasone (FLONASE) 50 MCG/ACT nasal spray Place 1 spray into both nostrils daily. 09/26/21  Yes Carlisle Beers, FNP  pantoprazole (PROTONIX) 40 MG tablet Take 1 tablet (40 mg total) by mouth daily. 09/26/21  Yes Carlisle Beers, FNP  amLODipine (NORVASC) 10 MG tablet Take 1 tablet (  10 mg total) by mouth at bedtime. 08/05/20   Kallie Locks, FNP  amLODipine (NORVASC) 10 MG tablet TAKE 1 TABLET (10 MG TOTAL) BY MOUTH AT BEDTIME. 08/14/21 11/12/21  Ivonne Andrew, NP  atorvastatin (LIPITOR) 20 MG tablet TAKE 1 TABLET (20 MG TOTAL) BY MOUTH DAILY. 08/14/21 11/12/21  Ivonne Andrew, NP  cycloSPORINE (RESTASIS) 0.05 % ophthalmic emulsion Place 1 drop into both eyes 2 (two) times daily. Patient not taking: Reported on 03/28/2021 08/05/20   Kallie Locks, FNP  hydrochlorothiazide (HYDRODIURIL) 25 MG tablet TAKE 1  TABLET (25 MG TOTAL) BY MOUTH DAILY. 08/05/20 08/29/21  Kallie Locks, FNP  hydrochlorothiazide (HYDRODIURIL) 25 MG tablet Take 1 tablet (25 mg total) by mouth daily. 08/14/21 11/12/21  Ivonne Andrew, NP  hydrocortisone (ANUSOL-HC) 25 MG suppository Place 1 suppository (25 mg total) rectally 2 (two) times daily. 05/21/21   Barbette Merino, NP  ibuprofen (ADVIL) 600 MG tablet Take 1 tablet (600 mg total) by mouth every 8 (eight) hours as needed. 08/14/21   Ivonne Andrew, NP  losartan (COZAAR) 25 MG tablet Take 1 tablet (25 mg total) by mouth at bedtime. 08/14/21   Ivonne Andrew, NP  olopatadine (PATANOL) 0.1 % ophthalmic solution Place 1 drop into both eyes 2 (two) times daily. 08/14/21   Ivonne Andrew, NP  sildenafil (VIAGRA) 50 MG tablet Take 1 tablet (50 mg total) by mouth daily as needed for erectile dysfunction. 08/05/20   Kallie Locks, FNP  sildenafil (VIAGRA) 50 MG tablet TAKE 1 TABLET (50 MG TOTAL) BY MOUTH DAILY AS NEEDED FOR ERECTILE DYSFUNCTION. 08/05/20 08/05/21  Kallie Locks, FNP    Family History Family History  Problem Relation Age of Onset   Colon cancer Neg Hx    Colon polyps Neg Hx    Esophageal cancer Neg Hx    Rectal cancer Neg Hx    Stomach cancer Neg Hx     Social History Social History   Tobacco Use   Smoking status: Never   Smokeless tobacco: Never  Vaping Use   Vaping Use: Never used  Substance Use Topics   Alcohol use: Never   Drug use: Never     Allergies   Patient has no known allergies.   Review of Systems Review of Systems  Respiratory:  Positive for cough.   Per HPI  Physical Exam Triage Vital Signs ED Triage Vitals [09/26/21 1220]  Enc Vitals Group     BP (!) 165/90     Pulse Rate 70     Resp 18     Temp 98 F (36.7 C)     Temp Source Oral     SpO2 97 %     Weight      Height      Head Circumference      Peak Flow      Pain Score 4     Pain Loc      Pain Edu?      Excl. in GC?    No data found.  Updated  Vital Signs BP (!) 165/90 (BP Location: Left Arm)   Pulse 70   Temp 98 F (36.7 C) (Oral)   Resp 18   SpO2 97%   Visual Acuity Right Eye Distance:   Left Eye Distance:   Bilateral Distance:    Right Eye Near:   Left Eye Near:    Bilateral Near:     Physical Exam Vitals and nursing note  reviewed.  Constitutional:      General: He is not in acute distress.    Appearance: Normal appearance. He is well-developed. He is not ill-appearing.     Comments: Very pleasant patient sitting comfortably on exam able in no acute distress.   HENT:     Head: Normocephalic and atraumatic.     Jaw: There is normal jaw occlusion.     Right Ear: Tympanic membrane, ear canal and external ear normal.     Left Ear: Tympanic membrane, ear canal and external ear normal.     Nose: Rhinorrhea present.     Right Turbinates: Swollen and pale.     Left Turbinates: Swollen and pale.     Right Sinus: No maxillary sinus tenderness or frontal sinus tenderness.     Left Sinus: No maxillary sinus tenderness or frontal sinus tenderness.     Comments: Mild erythema to nasal turbinates with minimal sinus drainage/congestion.     Mouth/Throat:     Lips: Pink.     Mouth: Mucous membranes are moist.     Pharynx: Posterior oropharyngeal erythema present. No uvula swelling.     Tonsils: No tonsillar exudate or tonsillar abscesses.     Comments: Mild erythema to posterior oropharynx with small amount of clear postnasal drainage visualized. Airway intact and patent. Eyes:     General: Lids are normal. Vision grossly intact. Gaze aligned appropriately. No visual field deficit.    Extraocular Movements: Extraocular movements intact.     Conjunctiva/sclera: Conjunctivae normal.     Right eye: Right conjunctiva is not injected.     Left eye: Left conjunctiva is not injected.     Pupils: Pupils are equal, round, and reactive to light.  Cardiovascular:     Rate and Rhythm: Normal rate and regular rhythm.     Chest Wall:  No thrill.     Heart sounds: Normal heart sounds, S1 normal and S2 normal. Heart sounds not distant. No murmur heard.   No friction rub. No gallop.  Pulmonary:     Effort: Pulmonary effort is normal. No respiratory distress.     Breath sounds: Normal breath sounds. No stridor or decreased air movement. No wheezing, rhonchi or rales.     Comments: No adventitious lung sounds auscultated.  Chest:     Chest wall: No tenderness.     Comments: Chest tenderness not reproducible to physical exam.  Abdominal:     General: Abdomen is flat. Bowel sounds are normal. There is no distension.     Palpations: Abdomen is soft.     Tenderness: There is abdominal tenderness in the epigastric area and left upper quadrant. There is no right CVA tenderness or left CVA tenderness. Negative signs include Murphy's sign and McBurney's sign.  Musculoskeletal:        General: No swelling. Normal range of motion.     Cervical back: Full passive range of motion without pain and neck supple.     Right lower leg: No edema.     Left lower leg: No edema.  Lymphadenopathy:     Cervical: No cervical adenopathy.  Skin:    General: Skin is warm and dry.     Capillary Refill: Capillary refill takes less than 2 seconds.     Findings: No rash.  Neurological:     General: No focal deficit present.     Mental Status: He is alert and oriented to person, place, and time. Mental status is at baseline.  Cranial Nerves: No cranial nerve deficit.     Motor: No weakness.     Gait: Gait is intact. Gait normal.  Psychiatric:        Attention and Perception: Attention and perception normal.        Mood and Affect: Mood normal.        Speech: Speech normal.        Behavior: Behavior normal. Behavior is cooperative.        Thought Content: Thought content normal.        Cognition and Memory: Cognition and memory normal.        Judgment: Judgment normal.     UC Treatments / Results  Labs (all labs ordered are listed, but  only abnormal results are displayed) Labs Reviewed  POCT URINALYSIS DIPSTICK, ED / UC - Abnormal; Notable for the following components:      Result Value   Hgb urine dipstick TRACE (*)    Protein, ur 30 (*)    All other components within normal limits    EKG   Radiology No results found.  Procedures Procedures (including critical care time)  Medications Ordered in UC Medications - No data to display  Initial Impression / Assessment and Plan / UC Course  I have reviewed the triage vital signs and the nursing notes.  Pertinent labs & imaging results that were available during my care of the patient were reviewed by me and considered in my medical decision making (see chart for details).  Patient is a 61 year old male presenting to urgent care with cough, nasal drainage, subjective fever, and chest comfort when he coughs. Symptoms are likely related to upper respiratory infection with underlying allergic rhinitis. Patient was prescribed allegra by PCP, but never picked up medication. Plan to prescribe cetirizine today to dry up patient's post nasal drainage that is causing his cough. Deferred imaging due to stable cardiopulmonary exam and vital signs. Doubt acute bacterial cause of patient's cough needing antibiotics at this time. No rhonchi to physical exam indicating bronchitis etiology requiring steroids. Suspect uncomplicated upper respiratory infection that will resolve in the next few days with supportive care prescriptions for cetirizine, Flonase, and tylenol for nasal congestion/inflammation and headache/fever.   Urinalysis showed proteinuria and trace hematuria. No clinical indication to treat patient for urinary tract infection at this time. Patient instructed to follow-up with his PCP regarding his urine results today.   EKG obtained today due to chest pressure complaint. EKG shows stable ST and T wave changes that are stable based on comparison to EKG in 2021. He has a past  medical history of chronic ischemic heart disease. Chest pain/pressure today is likely due to gastroesophageal reflux further evidenced by pain to palpation of his epigastrium and left upper quadrant abdomen. Patient is non-tender to palpation of his chest wall. PPI therapy initiated today with Protonix 40mg  once daily before breakfast. Patient to follow-up with PCP regarding this as well.   Counseled patient regarding appropriate use of medications and potential side effects for all medications recommended or prescribed today. Discussed red flag signs and symptoms of worsening condition,when to call the PCP office, return to urgent care, and when to seek higher level of care. Patient verbalizes understanding and agreement with plan. All questions answered. Patient discharged in stable condition.   Final Clinical Impressions(s) / UC Diagnoses   Final diagnoses:  Precordial pain  Viral URI with cough  Gastroesophageal reflux disease with esophagitis without hemorrhage     Discharge Instructions  You were seen in urgent care today for your viral upper respiratory infection with cough and your chest pain.  Your EKG was stable and did not show any acute abnormality requiring emergent evaluation.  I plan to treat your epigastric abdominal pain and chest pain with Protonix 40 mg to be taken once daily in the morning.  Avoid eating spicy foods, fatty foods, and foods that are citrus-based like tomatoes and oranges.  I have included information regarding appropriate food choices for acid reflux in your packet.  Please take Zyrtec every day for nasal drainage and apply 1 spray of Flonase into each nostril once daily.  This will help to decrease your nasal congestion/drainage and inflammation in your nose.  I believe that your cough is due to itch in the back of your throat from postnasal drainage.  These medications will help to dry up that drainage that is causing your cough.  You may continue to  take tylenol for your headache at home.  If your headache becomes worse or if it does not respond to Tylenol medication, please return to urgent care.   If you develop any new or worsening symptoms or do not improve in the next 2 to 3 days, please return.  If your symptoms are severe, please go to the emergency room.  Follow-up with your primary care provider for further evaluation and management of your symptoms as well as ongoing wellness visits.  I hope you feel better!       ED Prescriptions     Medication Sig Dispense Auth. Provider   cetirizine (ZYRTEC) 10 MG tablet Take 1 tablet (10 mg total) by mouth daily. 30 tablet Reita May M, FNP   fluticasone [facility]) 50 MCG/ACT nasal spray Place 1 spray into both nostrils daily. 16 g Reita May M, FNP   pantoprazole (PROTONIX) 40 MG tablet Take 1 tablet (40 mg total) by mouth daily. 30 tablet Reita May M, FNP   acetaminophen (TYLENOL) 500 MG tablet Take 2 tablets (1,000 mg total) by mouth every 6 (six) hours as needed. 30 tablet Carlisle Beers, FNP      PDMP not reviewed this encounter.   Carlisle Beers, FNP 09/26/21 1508    Carlisle Beers, FNP 09/26/21 [phone]

## 2021-09-26 NOTE — ED Triage Notes (Signed)
Pt c/o cough, congestion, and headaches x3 days. States had a fever last night and took tylenol.  ?

## 2021-09-26 NOTE — Discharge Instructions (Addendum)
You were seen in urgent care today for your viral upper respiratory infection with cough and your chest pain.  Your EKG was stable and did not show any acute abnormality requiring emergent evaluation.  I plan to treat your epigastric abdominal pain and chest pain with Protonix 40 mg to be taken once daily in the morning.  Avoid eating spicy foods, fatty foods, and foods that are citrus-based like tomatoes and oranges.  I have included information regarding appropriate food choices for acid reflux in your packet. ? ?Please take Zyrtec every day for nasal drainage and apply 1 spray of Flonase into each nostril once daily.  This will help to decrease your nasal congestion/drainage and inflammation in your nose.  I believe that your cough is due to itch in the back of your throat from postnasal drainage.  These medications will help to dry up that drainage that is causing your cough. ? ?You may continue to take tylenol for your headache at home.  If your headache becomes worse or if it does not respond to Tylenol medication, please return to urgent care.  ? ?If you develop any new or worsening symptoms or do not improve in the next 2 to 3 days, please return.  If your symptoms are severe, please go to the emergency room.  Follow-up with your primary care provider for further evaluation and management of your symptoms as well as ongoing wellness visits.  I hope you feel better! ? ? ?

## 2021-10-24 ENCOUNTER — Other Ambulatory Visit: Payer: Self-pay

## 2021-11-14 ENCOUNTER — Ambulatory Visit: Payer: BC Managed Care – PPO | Admitting: Nurse Practitioner

## 2021-11-14 ENCOUNTER — Ambulatory Visit (INDEPENDENT_AMBULATORY_CARE_PROVIDER_SITE_OTHER): Payer: BC Managed Care – PPO | Admitting: Nurse Practitioner

## 2021-11-14 ENCOUNTER — Other Ambulatory Visit: Payer: Self-pay

## 2021-11-14 ENCOUNTER — Encounter: Payer: Self-pay | Admitting: Nurse Practitioner

## 2021-11-14 VITALS — BP 137/88 | HR 71 | Resp 18

## 2021-11-14 DIAGNOSIS — R252 Cramp and spasm: Secondary | ICD-10-CM | POA: Diagnosis not present

## 2021-11-14 MED ORDER — AMLODIPINE BESYLATE 10 MG PO TABS
10.0000 mg | ORAL_TABLET | Freq: Every day | ORAL | 2 refills | Status: DC
  Filled 2021-11-14 – 2021-12-16 (×2): qty 30, 30d supply, fill #0
  Filled 2022-02-03: qty 30, 30d supply, fill #1

## 2021-11-14 MED ORDER — ATORVASTATIN CALCIUM 20 MG PO TABS
20.0000 mg | ORAL_TABLET | Freq: Every day | ORAL | 0 refills | Status: DC
  Filled 2021-11-14 – 2021-12-16 (×2): qty 90, 90d supply, fill #0

## 2021-11-14 MED ORDER — MELOXICAM 7.5 MG PO TABS
7.5000 mg | ORAL_TABLET | Freq: Every day | ORAL | 0 refills | Status: DC
  Filled 2021-11-14: qty 30, 30d supply, fill #0

## 2021-11-14 MED ORDER — TIZANIDINE HCL 4 MG PO TABS
4.0000 mg | ORAL_TABLET | Freq: Four times a day (QID) | ORAL | 0 refills | Status: DC | PRN
  Filled 2021-11-14: qty 30, 8d supply, fill #0

## 2021-11-14 NOTE — Progress Notes (Signed)
_0  ID: David Owen, male    DOB: [birth year], 61 y.o.   MRN: [id number]  Chief Complaint  Patient presents with   Follow-up    Referring provider: Vevelyn Francois, NP   HPI  Patient presents today for a follow up visit. He states that he still has watery eyes. He is using pataday drops. He did not follow up with optometry after last visit. States that blood pressure has been doing well. Patient continues to complain of intermittent hand cramping. Denies f/c/s, n/v/d, hemoptysis, PND, leg swelling Denies chest pain or edema           No Known Allergies  Immunization History  Administered Date(s) Administered   Hepatitis A, Adult 04/04/2020   Hepatitis B 01/24/2019   Influenza,inj,Quad PF,6+ Mos 04/04/2020, 04/29/2020, 03/28/2021   MMR 01/24/2019   PFIZER(Purple Top)SARS-COV-2 Vaccination 01/25/2019, 02/29/2020   Td 01/24/2019    Past Medical History:  Diagnosis Date   Acid reflux    Chronic ischemic heart disease 01/2020   Chronic kidney disease    stage III   Constipation    on miralax daily    Hemorrhoids    Hyperlipidemia    Hypertension    Sinus tachycardia by electrocardiogram 01/2020    Tobacco History: Social History   Tobacco Use  Smoking Status Never  Smokeless Tobacco Never   Counseling given: Not Answered   Outpatient Encounter Medications as of 11/14/2021  Medication Sig   meloxicam (MOBIC) 7.5 MG tablet Take 1 tablet (7.5 mg total) by mouth daily.   tiZANidine (ZANAFLEX) 4 MG tablet Take 1 tablet (4 mg total) by mouth every 6 (six) hours as needed for muscle spasms.   acetaminophen (TYLENOL) 500 MG tablet Take 2 tablets (1,000 mg total) by mouth every 6 (six) hours as needed.   amLODipine (NORVASC) 10 MG tablet Take 1 tablet (10 mg total) by mouth at bedtime.   amLODipine (NORVASC) 10 MG tablet TAKE 1 TABLET (10 MG TOTAL) BY MOUTH AT BEDTIME.   atorvastatin (LIPITOR) 20 MG tablet TAKE 1 TABLET (20 MG TOTAL) BY MOUTH DAILY.    cetirizine (ZYRTEC) 10 MG tablet Take 1 tablet (10 mg total) by mouth daily.   cycloSPORINE (RESTASIS) 0.05 % ophthalmic emulsion Place 1 drop into both eyes 2 (two) times daily. (Patient not taking: Reported on 03/28/2021)   fluticasone (FLONASE) 50 MCG/ACT nasal spray Place 1 spray into both nostrils daily.   hydrochlorothiazide (HYDRODIURIL) 25 MG tablet TAKE 1 TABLET (25 MG TOTAL) BY MOUTH DAILY.   hydrochlorothiazide (HYDRODIURIL) 25 MG tablet Take 1 tablet (25 mg total) by mouth daily.   hydrocortisone (ANUSOL-HC) 25 MG suppository Place 1 suppository (25 mg total) rectally 2 (two) times daily.   ibuprofen (ADVIL) 600 MG tablet Take 1 tablet (600 mg total) by mouth every 8 (eight) hours as needed.   losartan (COZAAR) 25 MG tablet Take 1 tablet (25 mg total) by mouth at bedtime.   olopatadine (PATANOL) 0.1 % ophthalmic solution Place 1 drop into both eyes 2 (two) times daily.   pantoprazole (PROTONIX) 40 MG tablet Take 1 tablet (40 mg total) by mouth daily.   sildenafil (VIAGRA) 50 MG tablet Take 1 tablet (50 mg total) by mouth daily as needed for erectile dysfunction.   sildenafil (VIAGRA) 50 MG tablet TAKE 1 TABLET (50 MG TOTAL) BY MOUTH DAILY AS NEEDED FOR ERECTILE DYSFUNCTION.   [DISCONTINUED] amLODipine (NORVASC) 10 MG tablet TAKE 1 TABLET (10 MG TOTAL) BY MOUTH AT BEDTIME.   [  DISCONTINUED] atorvastatin (LIPITOR) 20 MG tablet TAKE 1 TABLET (20 MG TOTAL) BY MOUTH DAILY.   No facility-administered encounter medications on file as of 11/14/2021.     Review of Systems  Review of Systems  Constitutional: Negative.   HENT: Negative.    Cardiovascular: Negative.   Gastrointestinal: Negative.   Allergic/Immunologic: Negative.   Neurological: Negative.   Psychiatric/Behavioral: Negative.         Physical Exam  BP 137/88   Pulse 71   Resp 18   SpO2 99%   Wt Readings from Last 5 Encounters:  08/14/21 179 lb 6 oz (81.4 kg)  03/28/21 178 lb (80.7 kg)  08/05/20 181 lb (82.1 kg)   07/01/20 185 lb 3.2 oz (84 kg)  06/05/20 186 lb 2 oz (84.4 kg)     Physical Exam Vitals and nursing note reviewed.  Constitutional:      General: He is not in acute distress.    Appearance: He is well-developed.  Cardiovascular:     Rate and Rhythm: Normal rate and regular rhythm.  Pulmonary:     Effort: Pulmonary effort is normal.     Breath sounds: Normal breath sounds.  Skin:    General: Skin is warm and dry.  Neurological:     Mental Status: He is alert and oriented to person, place, and time.      Lab Results:  CBC    Component Value Date/Time   WBC 5.9 08/14/2021 1007   WBC 8.3 02/15/2020 1321   RBC 5.47 08/14/2021 1007   RBC 5.04 02/15/2020 1321   HGB 15.7 08/14/2021 1007   HCT 47.5 08/14/2021 1007   PLT 176 08/14/2021 1007   MCV 87 08/14/2021 1007   MCH 28.7 08/14/2021 1007   MCH 28.8 02/15/2020 1321   MCHC 33.1 08/14/2021 1007   MCHC 33.4 02/15/2020 1321   RDW 13.5 08/14/2021 1007   LYMPHSABS 1.8 08/05/2020 1135   EOSABS 0.2 08/05/2020 1135   BASOSABS 0.1 08/05/2020 1135    BMET    Component Value Date/Time   NA 143 08/14/2021 1007   K 3.8 08/14/2021 1007   CL 106 08/14/2021 1007   CO2 23 08/14/2021 1007   GLUCOSE 104 (H) 08/14/2021 1007   GLUCOSE 152 (H) 02/15/2020 1321   BUN 16 08/14/2021 1007   CREATININE 1.16 08/14/2021 1007   CALCIUM 9.6 08/14/2021 1007   GFRNONAA 62 06/05/2020 1118   GFRAA 72 06/05/2020 1118    BNP No results found for: "BNP"  ProBNP No results found for: "PROBNP"  Imaging: No results found.   Assessment & Plan:   Hand cramps - meloxicam (MOBIC) 7.5 MG tablet; Take 1 tablet (7.5 mg total) by mouth daily.  Dispense: 30 tablet; Refill: 0 - tiZANidine (ZANAFLEX) 4 MG tablet; Take 1 tablet (4 mg total) by mouth every 6 (six) hours as needed for muscle spasms.  Dispense: 30 tablet; Refill: 0  Follow up:  Follow up in 3 months or sooner if needed     Fenton Foy, NP 11/14/2021

## 2021-11-14 NOTE — Patient Instructions (Signed)
1. Hand cramps  - meloxicam (MOBIC) 7.5 MG tablet; Take 1 tablet (7.5 mg total) by mouth daily.  Dispense: 30 tablet; Refill: 0 - tiZANidine (ZANAFLEX) 4 MG tablet; Take 1 tablet (4 mg total) by mouth every 6 (six) hours as needed for muscle spasms.  Dispense: 30 tablet; Refill: 0  Follow up:  Follow up in 3 months or sooner if needed

## 2021-11-14 NOTE — Assessment & Plan Note (Signed)
-   meloxicam (MOBIC) 7.5 MG tablet; Take 1 tablet (7.5 mg total) by mouth daily.  Dispense: 30 tablet; Refill: 0 - tiZANidine (ZANAFLEX) 4 MG tablet; Take 1 tablet (4 mg total) by mouth every 6 (six) hours as needed for muscle spasms.  Dispense: 30 tablet; Refill: 0  Follow up:  Follow up in 3 months or sooner if needed

## 2021-12-16 ENCOUNTER — Other Ambulatory Visit: Payer: Self-pay

## 2022-01-08 ENCOUNTER — Other Ambulatory Visit: Payer: Self-pay

## 2022-01-08 ENCOUNTER — Ambulatory Visit (HOSPITAL_COMMUNITY)
Admission: EM | Admit: 2022-01-08 | Discharge: 2022-01-08 | Disposition: A | Discharge status: Home / Self Care | Payer: BC Managed Care – PPO | Attending: Internal Medicine | Admitting: Internal Medicine

## 2022-01-08 ENCOUNTER — Encounter (HOSPITAL_COMMUNITY): Payer: Self-pay | Admitting: *Deleted

## 2022-01-08 DIAGNOSIS — Z9109 Other allergy status, other than to drugs and biological substances: Secondary | ICD-10-CM

## 2022-01-08 DIAGNOSIS — I10 Essential (primary) hypertension: Secondary | ICD-10-CM | POA: Diagnosis not present

## 2022-01-08 DIAGNOSIS — H538 Other visual disturbances: Secondary | ICD-10-CM

## 2022-01-08 DIAGNOSIS — R252 Cramp and spasm: Secondary | ICD-10-CM

## 2022-01-08 DIAGNOSIS — J069 Acute upper respiratory infection, unspecified: Secondary | ICD-10-CM

## 2022-01-08 DIAGNOSIS — R519 Headache, unspecified: Secondary | ICD-10-CM

## 2022-01-08 LAB — POCT URINALYSIS DIPSTICK, ED / UC
Bilirubin Urine: NEGATIVE
Glucose, UA: NEGATIVE mg/dL
Ketones, ur: NEGATIVE mg/dL
Leukocytes,Ua: NEGATIVE
Nitrite: NEGATIVE
Protein, ur: NEGATIVE mg/dL
Specific Gravity, Urine: 1.015 (ref 1.005–1.030)
Urobilinogen, UA: 0.2 mg/dL (ref 0.0–1.0)
pH: 6 (ref 5.0–8.0)

## 2022-01-08 LAB — CBG MONITORING, ED: Glucose-Capillary: 127 mg/dL — ABNORMAL HIGH (ref 70–99)

## 2022-01-08 MED ORDER — CETIRIZINE HCL 10 MG PO TABS
10.0000 mg | ORAL_TABLET | Freq: Every day | ORAL | 2 refills | Status: DC
Start: 2022-01-08 — End: 2023-09-16
  Filled 2022-01-08: qty 30, 30d supply, fill #0

## 2022-01-08 MED ORDER — FLUTICASONE PROPIONATE 50 MCG/ACT NA SUSP
1.0000 | Freq: Every day | NASAL | 2 refills | Status: DC
  Filled 2022-01-08: qty 16, 30d supply, fill #0
  Filled 2022-12-03: qty 16, 30d supply, fill #1

## 2022-01-08 MED ORDER — ACETAMINOPHEN 500 MG PO TABS
1000.0000 mg | ORAL_TABLET | Freq: Four times a day (QID) | ORAL | 0 refills | Status: AC | PRN
  Filled 2022-01-08: qty 30, 4d supply, fill #0

## 2022-01-08 MED ORDER — ACETAMINOPHEN 325 MG PO TABS
ORAL_TABLET | ORAL | Status: AC
  Filled 2022-01-08: qty 3

## 2022-01-08 MED ORDER — ACETAMINOPHEN 325 MG PO TABS
975.0000 mg | ORAL_TABLET | Freq: Once | ORAL | Status: AC
  Administered 2022-01-08: 975 mg via ORAL

## 2022-01-08 MED ORDER — OLOPATADINE HCL 0.1 % OP SOLN
1.0000 [drp] | Freq: Two times a day (BID) | OPHTHALMIC | 12 refills | Status: DC
  Filled 2022-01-08: qty 5, 50d supply, fill #0

## 2022-01-08 MED ORDER — BENZONATATE 100 MG PO CAPS
100.0000 mg | ORAL_CAPSULE | Freq: Three times a day (TID) | ORAL | 0 refills | Status: DC
  Filled 2022-01-08: qty 21, 7d supply, fill #0

## 2022-01-08 MED ORDER — IBUPROFEN 600 MG PO TABS
600.0000 mg | ORAL_TABLET | Freq: Three times a day (TID) | ORAL | 0 refills | Status: AC | PRN
  Filled 2022-01-08: qty 30, 10d supply, fill #0

## 2022-01-08 NOTE — ED Provider Notes (Signed)
[place]    CSN: [id number] Arrival date & time: 01/08/22  0920      History   Chief Complaint Chief Complaint  Patient presents with   Cough    HPI David Owen is a 61 y.o. male.   Patient presents to urgent care for evaluation of flulike symptoms that started 2 days ago on Tuesday, January 06, 2021.  He is reporting runny nose, coughing, and headache.  Headache is generalized and cough is dry.  Denies fever/chills.  Cough causes patient to have upper back discomfort.  Tuesday he started having blurry vision that has been constant since onset.  Reports watery/itchy eyes, eye drainage, and generalized body aches.  Nasal mucus is watery as well.  He denies any known sick contacts with similar symptoms.  No sore throat, decreased appetite, ear pain, shortness of breath, chest pain, dizziness, or urinary symptoms reported.  Denies history of seasonal allergies and asthma.  Blood pressure is noticeably elevated in clinic at 183/118.  Patient states that he took his blood pressure medications prior to arrival at urgent care.  Denies decreased visual acuity although he is having some blurry vision.  He is not a diabetic but reports increased thirst and frequency of urination recently.  Denies dysuria, abdominal discomfort, nausea, vomiting, and constipation/diarrhea.  Last bowel movement was today and normal without blood/mucus.  He is not a smoker, denies other drug use, and does not drink alcohol.  He has not attempted use of any over-the-counter medications prior to arrival care other than aspirin which did not particularly help with his headache/back pain which he currently rates it an 8 on a scale of 0-10.  No other triggering or relieving factors identified at this time per patient symptoms.   Cough   Past Medical History:  Diagnosis Date   Acid reflux    Chronic ischemic heart disease 01/2020   Chronic kidney disease    stage III   Constipation    on miralax daily     Hemorrhoids    Hyperlipidemia    Hypertension    Sinus tachycardia by electrocardiogram 01/2020    Patient Active Problem List   Diagnosis Date Noted   Hand cramps 11/14/2021   Left Achilles tendinitis 07/01/2020   Hypercalcemia 06/05/2020   Bilateral hand pain 04/30/2020   Carpal tunnel syndrome 04/29/2020   ACE-inhibitor cough 04/04/2020   Chronic cough 03/24/2020   Erectile dysfunction 02/28/2020   Dry eye syndrome of both eyes 12/18/2019   Mixed hyperlipidemia 11/28/2019   Essential hypertension 11/21/2019   History of syphilis 11/21/2019   Gastroesophageal reflux disease 11/21/2019   Chronic bilateral low back pain without sciatica 11/21/2019   Refugee health examination 11/19/2019    Past Surgical History:  Procedure Laterality Date   COLONOSCOPY  01/2020   He will repeat colonoscopy in 7 years   MULTIPLE TOOTH EXTRACTIONS     NO PAST SURGERIES         Home Medications    Prior to Admission medications   Medication Sig Start Date End Date Taking? Authorizing Provider  amLODipine (NORVASC) 10 MG tablet TAKE 1 TABLET (10 MG TOTAL) BY MOUTH AT BEDTIME. 11/14/21 02/12/22 Yes Fenton Foy, NP  benzonatate (TESSALON) 100 MG capsule Take 1 capsule (100 mg total) by mouth every 8 (eight) hours. 01/08/22  Yes Talbot Grumbling, FNP  sildenafil (VIAGRA) 50 MG tablet Take 1 tablet (50 mg total) by mouth daily as needed for erectile dysfunction. 08/05/20  Yes Azzie Glatter, FNP  acetaminophen (TYLENOL) 500 MG tablet Take 2 tablets (1,000 mg total) by mouth every 6 (six) hours as needed. 01/08/22   Talbot Grumbling, FNP  amLODipine (NORVASC) 10 MG tablet Take 1 tablet (10 mg total) by mouth at bedtime. 08/05/20   Azzie Glatter, FNP  atorvastatin (LIPITOR) 20 MG tablet TAKE 1 TABLET (20 MG TOTAL) BY MOUTH DAILY. 11/14/21 03/16/22  Fenton Foy, NP  cetirizine (ZYRTEC) 10 MG tablet Take 1 tablet (10 mg total) by mouth daily. 01/08/22   Talbot Grumbling,  FNP  cycloSPORINE (RESTASIS) 0.05 % ophthalmic emulsion Place 1 drop into both eyes 2 (two) times daily. Patient not taking: Reported on 03/28/2021 08/05/20   Azzie Glatter, FNP  fluticasone [facility]) 50 MCG/ACT nasal spray Place 1 spray into both nostrils daily. 01/08/22   Talbot Grumbling, FNP  hydrochlorothiazide (HYDRODIURIL) 25 MG tablet TAKE 1 TABLET (25 MG TOTAL) BY MOUTH DAILY. 08/05/20 08/29/21  Azzie Glatter, FNP  hydrochlorothiazide (HYDRODIURIL) 25 MG tablet Take 1 tablet (25 mg total) by mouth daily. 08/14/21 01/15/22  Fenton Foy, NP  hydrocortisone (ANUSOL-HC) 25 MG suppository Place 1 suppository (25 mg total) rectally 2 (two) times daily. 05/21/21   Vevelyn Francois, NP  ibuprofen (ADVIL) 600 MG tablet Take 1 tablet (600 mg total) by mouth every 8 (eight) hours as needed. 01/08/22   Talbot Grumbling, FNP  losartan (COZAAR) 25 MG tablet Take 1 tablet (25 mg total) by mouth at bedtime. 08/14/21   Fenton Foy, NP  meloxicam (MOBIC) 7.5 MG tablet Take 1 tablet (7.5 mg total) by mouth daily. 11/14/21   Fenton Foy, NP  olopatadine (PATANOL) 0.1 % ophthalmic solution Place 1 drop into both eyes 2 (two) times daily. 01/08/22   Talbot Grumbling, FNP  pantoprazole (PROTONIX) 40 MG tablet Take 1 tablet (40 mg total) by mouth daily. 09/26/21   Talbot Grumbling, FNP  sildenafil (VIAGRA) 50 MG tablet TAKE 1 TABLET (50 MG TOTAL) BY MOUTH DAILY AS NEEDED FOR ERECTILE DYSFUNCTION. 08/05/20 08/05/21  Azzie Glatter, FNP  tiZANidine (ZANAFLEX) 4 MG tablet Take 1 tablet (4 mg total) by mouth every 6 (six) hours as needed for muscle spasms. 11/14/21   Fenton Foy, NP    Family History Family History  Problem Relation Age of Onset   Healthy Mother    Healthy Father    Colon cancer Neg Hx    Colon polyps Neg Hx    Esophageal cancer Neg Hx    Rectal cancer Neg Hx    Stomach cancer Neg Hx     Social History Social History   Tobacco Use   Smoking status:  Never   Smokeless tobacco: Never  Vaping Use   Vaping Use: Never used  Substance Use Topics   Alcohol use: Never   Drug use: Never     Allergies   Patient has no known allergies.   Review of Systems Review of Systems  Respiratory:  Positive for cough.   Per HPI   Physical Exam Triage Vital Signs ED Triage Vitals  Enc Vitals Group     BP 01/08/22 0955 (!) 183/118     Pulse Rate 01/08/22 0955 80     Resp 01/08/22 0955 18     Temp 01/08/22 0955 99.6 F (37.6 C)     Temp Source 01/08/22 0955 Oral     SpO2 01/08/22 0955 96 %  Weight --      Height --      Head Circumference --      Peak Flow --      Pain Score 01/08/22 0950 8     Pain Loc --      Pain Edu? --      Excl. in [place]? --    No data found.  Updated Vital Signs BP (!) 175/103 (BP Location: Left Arm)   Pulse 80   Temp 99.6 F (37.6 C) (Oral)   Resp 18   SpO2 96%   Visual Acuity Right Eye Distance:   Left Eye Distance:   Bilateral Distance:    Right Eye Near:   Left Eye Near:    Bilateral Near:     Physical Exam Vitals and nursing note reviewed.  Constitutional:      Appearance: Normal appearance. He is not ill-appearing or toxic-appearing.     Comments: Very pleasant patient sitting on exam in position of comfort table in no acute distress.   HENT:     Head: Normocephalic and atraumatic.     Right Ear: Hearing, tympanic membrane, ear canal and external ear normal.     Left Ear: Hearing, tympanic membrane, ear canal and external ear normal.     Nose: Rhinorrhea present. Rhinorrhea is clear and purulent.     Right Turbinates: Swollen and pale.     Left Turbinates: Swollen and pale.     Mouth/Throat:     Lips: Pink.     Mouth: Mucous membranes are moist.     Pharynx: No posterior oropharyngeal erythema.  Eyes:     General: Lids are normal. Vision grossly intact. Gaze aligned appropriately. No visual field deficit.       Right eye: No hordeolum.        Left eye: No hordeolum.      Extraocular Movements: Extraocular movements intact.     Conjunctiva/sclera: Conjunctivae normal.     Right eye: Right conjunctiva is not injected.     Left eye: Left conjunctiva is not injected.     Pupils: Pupils are equal, round, and reactive to light.  Cardiovascular:     Rate and Rhythm: Normal rate and regular rhythm.     Heart sounds: Normal heart sounds, S1 normal and S2 normal.  Pulmonary:     Effort: Pulmonary effort is normal. No respiratory distress.     Breath sounds: Normal breath sounds and air entry.  Abdominal:     General: Abdomen is flat. Bowel sounds are normal.     Palpations: Abdomen is soft.     Tenderness: There is no abdominal tenderness. There is no guarding.     Comments: Very mild CVA tenderness present to exam.  No peritoneal signs.  Musculoskeletal:     Cervical back: Neck supple.  Skin:    General: Skin is warm and dry.     Capillary Refill: Capillary refill takes less than 2 seconds.     Findings: No rash.  Neurological:     General: No focal deficit present.     Mental Status: He is alert and oriented to person, place, and time. Mental status is at baseline.     Cranial Nerves: Cranial nerves 2-12 are intact. No dysarthria or facial asymmetry.     Sensory: Sensation is intact.     Motor: Motor function is intact.     Coordination: Coordination is intact.     Gait: Gait is intact.  Psychiatric:  Mood and Affect: Mood normal.        Speech: Speech normal.        Behavior: Behavior normal.        Thought Content: Thought content normal.        Judgment: Judgment normal.      UC Treatments / Results  Labs (all labs ordered are listed, but only abnormal results are displayed) Labs Reviewed  POCT URINALYSIS DIPSTICK, ED / UC - Abnormal; Notable for the following components:      Result Value   Hgb urine dipstick TRACE (*)    All other components within normal limits  CBG MONITORING, ED - Abnormal; Notable for the following components:    Glucose-Capillary 127 (*)    All other components within normal limits    EKG   Radiology No results found.  Procedures Procedures (including critical care time)  Medications Ordered in UC Medications  acetaminophen (TYLENOL) tablet 975 mg (has no administration in time range)    Initial Impression / Assessment and Plan / UC Course  I have reviewed the triage vital signs and the nursing notes.  Pertinent labs & imaging results that were available during my care of the patient were reviewed by me and considered in my medical decision making (see chart for details).   1.  Viral URI Symptoms and physical exam consistent with viral upper respiratory tract infection.  Considered possible allergic rhinitis etiology, but unlikely due to mildly elevated temperature in clinic and subjective report of generalized body aches.  Patient is well-appearing with a nonfocal neuro exam.  Viral testing is not indicated at this time.  We will manage this with prescriptions for supportive care and symptomatic relief.  Flonase and cetirizine may be used once daily to reduce inflammation and rhinorrhea.  Cough is likely related to postnasal drainage and will likely respond well to [facility] every 8 hours as needed for the cough.  Olopatadine eyedrops prescribed for allergic conjunctivitis symptoms to be used twice daily as needed.  Tylenol may be used every 6 hours as needed for body aches and pains as well as fever/chills.  First dose of Tylenol given in clinic for headache and mildly elevated temperature.   2.  Essential hypertension and blurry vision Neurologic exam is normal and without focal deficit.  Blood pressure reduced to 175/103 upon recheck.  CBG 127 in clinic.  Urine is negative for signs of urinary tract infection and dehydration.  Advised patient to follow-up with PCP regarding elevated blood pressure reading in clinic today to discuss medication regimen.  Advised patient to lower salt  intake and increase exercise to help further reduce blood pressure.  No indication for immediate referral to a higher level of care due to stable physical exam findings and hemodynamically stable vital signs.  Patient agreeable with this plan.  Discussed physical exam and available lab work findings in clinic with patient.  Counseled patient regarding appropriate use of medications and potential side effects for all medications recommended or prescribed today. Discussed red flag signs and symptoms of worsening condition,when to call the PCP office, return to urgent care, and when to seek higher level of care in the emergency department. Patient verbalizes understanding and agreement with plan. All questions answered. Patient discharged in stable condition.  Final Clinical Impressions(s) / UC Diagnoses   Final diagnoses:  Viral upper respiratory infection  Essential hypertension     Discharge Instructions      Votre urine et votre glycmie sont normales.  Votre tension artrielle est leve  la clinique. 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 Diminuez votre consommation de sel et Statistician votre activit physique. Vous souffrez galement d'une infection virale des voies respiratoires suprieures. Prenez du Tylenol et de l'ibuprofne toutes les 6 heures au besoin  la maison pour les maux de tte et les courbatures. Utilisez quotidiennement le spray Flonase 1 dans chaque narine en cas de congestion nasale et d'inflammation. Utilisez Tessalon Perles toutes les 8 heures au besoin en cas de toux. [place] un collyre  l'olopatadine 2 fois par jour pour les yeux [employer]. Prenez de la ctirizine [employer] par jour pour asscher votre congestion nasale. Retournez aux soins d'urgence si ncessaire. Si votre mal de tte ne s'amliore pas au cours des 12  24 prochaines  heures grce aux mdicaments, si vous dveloppez des symptmes neurologiques nouveaux ou qui s'aggravent, ou si des symptmes nouveaux ou s'aggravent qui sont graves, veuillez vous rendre aux urgences pour [employer].   Your urine and blood sugar are normal. Your blood pressure is elevated in the clinic. I would like for you to schedule an appointment with your primary care provider to follow-up on your high blood pressure to discuss medications. Decrease your salt intake and increase your exercise.   You also have a viral upper respiratory tract infection.  Take Tylenol and ibuprofen every 6 hours as needed at home for headache and body aches.  Use Flonase 1 spray in each nostril daily for nasal congestion and inflammation.  Use Tessalon Perles every 8 hours as needed for cough. Use olopatadine eyedrops 2 times daily for watery itchy eyes.  Take cetirizine once daily to dry up your nasal congestion.   Return to urgent care as needed.  If your headache does not improve over the next 12 to 24 hours with medications, you develop new or worsening neurologic symptoms, or any new or worsening symptoms that are severe, please go to the emergency room for evaluation.     ED Prescriptions     Medication Sig Dispense Auth. Provider   fluticasone (FLONASE) 50 MCG/ACT nasal spray Place 1 spray into both nostrils daily. 16 g Joella Prince M, FNP   ibuprofen (ADVIL) 600 MG tablet Take 1 tablet (600 mg total) by mouth every 8 (eight) hours as needed. 30 tablet Joella Prince M, FNP   olopatadine (PATANOL) 0.1 % ophthalmic solution Place 1 drop into both eyes 2 (two) times daily. 5 mL Joella Prince M, FNP   acetaminophen (TYLENOL) 500 MG tablet Take 2 tablets (1,000 mg total) by mouth every 6 (six) hours as needed. 30 tablet Joella Prince M, FNP   benzonatate (TESSALON) 100 MG capsule Take 1 capsule (100 mg total) by mouth every 8 (eight) hours. 21 capsule Talbot Grumbling,  FNP   cetirizine (ZYRTEC) 10 MG tablet Take 1 tablet (10 mg total) by mouth daily. 30 tablet Talbot Grumbling, FNP      PDMP not reviewed this encounter.   Joella Prince [place], [place] 01/09/22 [phone]

## 2022-01-08 NOTE — Discharge Instructions (Addendum)
Votre urine et votre glycmie [employer].  Votre tension artrielle est leve  la clinique. 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 Diminuez votre consommation de sel et Statistician votre activit physique. Vous souffrez galement d'une infection virale des voies respiratoires suprieures. Prenez du Tylenol et de l'ibuprofne toutes les 6 heures au besoin  la maison pour les maux de tte et les courbatures. Utilisez quotidiennement le spray Flonase 1 dans chaque narine en cas de congestion nasale et d'inflammation. Utilisez Tessalon Perles toutes les 8 heures au besoin en cas de toux. Deforest Hoyles un collyre  l'olopatadine 2 fois par jour pour les yeux [employer]. Prenez de la ctirizine [employer] par jour pour asscher votre congestion nasale. Retournez aux soins d'urgence si ncessaire. Si votre mal de tte ne s'amliore pas au cours des 12  24 prochaines heures grce aux mdicaments, si vous dveloppez des symptmes neurologiques nouveaux ou qui s'aggravent, ou si des symptmes nouveaux ou s'aggravent qui sont graves, veuillez vous rendre aux urgences pour [employer].   Your urine and blood sugar are normal. Your blood pressure is elevated in the clinic. I would like for you to schedule an appointment with your primary care provider to follow-up on your high blood pressure to discuss medications. Decrease your salt intake and increase your exercise.   You also have a viral upper respiratory tract infection.  Take Tylenol and ibuprofen every 6 hours as needed at home for headache and body aches.  Use Flonase 1 spray in each nostril daily for nasal congestion and inflammation.  Use Tessalon Perles every 8 hours as needed for cough. Use olopatadine eyedrops 2 times daily for watery itchy eyes.  Take cetirizine once daily to dry up your nasal congestion.    Return to urgent care as needed.  If your headache does not improve over the next 12 to 24 hours with medications, you develop new or worsening neurologic symptoms, or any new or worsening symptoms that are severe, please go to the emergency room for evaluation.

## 2022-01-08 NOTE — ED Triage Notes (Signed)
Patient states through translator that for the last few day he has had headache, cough and when he coughs he has back pain. He states that he has taken ASA for the headache.

## 2022-02-03 ENCOUNTER — Other Ambulatory Visit: Payer: Self-pay

## 2022-02-05 ENCOUNTER — Other Ambulatory Visit: Payer: Self-pay | Admitting: Nurse Practitioner

## 2022-02-05 ENCOUNTER — Other Ambulatory Visit: Payer: Self-pay

## 2022-02-05 DIAGNOSIS — R252 Cramp and spasm: Secondary | ICD-10-CM

## 2022-02-05 DIAGNOSIS — I10 Essential (primary) hypertension: Secondary | ICD-10-CM

## 2022-02-05 DIAGNOSIS — R058 Other specified cough: Secondary | ICD-10-CM

## 2022-02-09 ENCOUNTER — Other Ambulatory Visit: Payer: Self-pay | Admitting: Nurse Practitioner

## 2022-02-09 ENCOUNTER — Other Ambulatory Visit: Payer: Self-pay

## 2022-02-09 DIAGNOSIS — R252 Cramp and spasm: Secondary | ICD-10-CM

## 2022-02-09 DIAGNOSIS — R058 Other specified cough: Secondary | ICD-10-CM

## 2022-02-09 DIAGNOSIS — I10 Essential (primary) hypertension: Secondary | ICD-10-CM

## 2022-02-09 MED ORDER — HYDROCHLOROTHIAZIDE 25 MG PO TABS
25.0000 mg | ORAL_TABLET | Freq: Every day | ORAL | 2 refills | Status: DC
  Filled 2022-02-09: qty 30, 30d supply, fill #0

## 2022-02-09 MED ORDER — ATORVASTATIN CALCIUM 20 MG PO TABS
20.0000 mg | ORAL_TABLET | Freq: Every day | ORAL | 0 refills | Status: DC
  Filled 2022-02-09: qty 90, 90d supply, fill #0

## 2022-02-09 MED ORDER — LOSARTAN POTASSIUM 25 MG PO TABS
25.0000 mg | ORAL_TABLET | Freq: Every day | ORAL | 0 refills | Status: DC
  Filled 2022-02-09: qty 30, 30d supply, fill #0

## 2022-02-09 MED ORDER — MELOXICAM 7.5 MG PO TABS
7.5000 mg | ORAL_TABLET | Freq: Every day | ORAL | 0 refills | Status: AC
  Filled 2022-02-09: qty 30, 30d supply, fill #0

## 2022-02-09 MED ORDER — TIZANIDINE HCL 4 MG PO TABS
4.0000 mg | ORAL_TABLET | Freq: Four times a day (QID) | ORAL | 0 refills | Status: DC | PRN
  Filled 2022-02-09: qty 30, 8d supply, fill #0

## 2022-02-16 ENCOUNTER — Encounter: Payer: Self-pay | Admitting: Nurse Practitioner

## 2022-02-16 ENCOUNTER — Other Ambulatory Visit: Payer: Self-pay

## 2022-02-16 ENCOUNTER — Ambulatory Visit (INDEPENDENT_AMBULATORY_CARE_PROVIDER_SITE_OTHER): Payer: BC Managed Care – PPO | Admitting: Nurse Practitioner

## 2022-02-16 VITALS — BP 143/86 | HR 73 | Temp 98.3°F | Ht 65.0 in | Wt 181.0 lb

## 2022-02-16 DIAGNOSIS — Z9109 Other allergy status, other than to drugs and biological substances: Secondary | ICD-10-CM

## 2022-02-16 DIAGNOSIS — K219 Gastro-esophageal reflux disease without esophagitis: Secondary | ICD-10-CM | POA: Diagnosis not present

## 2022-02-16 DIAGNOSIS — I10 Essential (primary) hypertension: Secondary | ICD-10-CM

## 2022-02-16 DIAGNOSIS — R042 Hemoptysis: Secondary | ICD-10-CM

## 2022-02-16 DIAGNOSIS — T464X5A Adverse effect of angiotensin-converting-enzyme inhibitors, initial encounter: Secondary | ICD-10-CM

## 2022-02-16 DIAGNOSIS — R058 Other specified cough: Secondary | ICD-10-CM

## 2022-02-16 MED ORDER — ATORVASTATIN CALCIUM 20 MG PO TABS
20.0000 mg | ORAL_TABLET | Freq: Every day | ORAL | 0 refills | Status: DC
  Filled 2022-02-16 – 2022-03-18 (×2): qty 90, 90d supply, fill #0

## 2022-02-16 MED ORDER — AMLODIPINE BESYLATE 10 MG PO TABS
10.0000 mg | ORAL_TABLET | Freq: Every day | ORAL | 3 refills | Status: DC
  Filled 2022-02-16 – 2022-03-18 (×2): qty 90, 90d supply, fill #0

## 2022-02-16 MED ORDER — OLOPATADINE HCL 0.1 % OP SOLN
1.0000 [drp] | Freq: Two times a day (BID) | OPHTHALMIC | 12 refills | Status: DC
  Filled 2022-02-16 – 2022-05-08 (×2): qty 5, 50d supply, fill #0
  Filled 2022-10-28: qty 5, 50d supply, fill #1

## 2022-02-16 MED ORDER — PANTOPRAZOLE SODIUM 40 MG PO TBEC
40.0000 mg | DELAYED_RELEASE_TABLET | Freq: Every day | ORAL | 1 refills | Status: DC
  Filled 2022-02-16: qty 30, 30d supply, fill #0
  Filled 2022-03-18: qty 30, 30d supply, fill #1

## 2022-02-16 MED ORDER — LOSARTAN POTASSIUM 25 MG PO TABS
25.0000 mg | ORAL_TABLET | Freq: Every day | ORAL | 0 refills | Status: DC
  Filled 2022-02-16 – 2022-03-18 (×2): qty 90, 90d supply, fill #0

## 2022-02-16 MED ORDER — HYDROCHLOROTHIAZIDE 25 MG PO TABS
25.0000 mg | ORAL_TABLET | Freq: Every day | ORAL | 2 refills | Status: DC
  Filled 2022-02-16 – 2022-03-19 (×2): qty 30, 30d supply, fill #0
  Filled 2022-05-08: qty 30, 30d supply, fill #1

## 2022-02-16 NOTE — Assessment & Plan Note (Signed)
-   amLODipine (NORVASC) 10 MG tablet; Take 1 tablet (10 mg total) by mouth at bedtime.  Dispense: 90 tablet; Refill: 3 - hydrochlorothiazide (HYDRODIURIL) 25 MG tablet; Take 1 tablet (25 mg total) by mouth daily.  Dispense: 30 tablet; Refill: 2 - losartan (COZAAR) 25 MG tablet; Take 1 tablet (25 mg total) by mouth at bedtime.  Dispense: 90 tablet; Refill: 0  2. ACE-inhibitor cough  - losartan (COZAAR) 25 MG tablet; Take 1 tablet (25 mg total) by mouth at bedtime.  Dispense: 90 tablet; Refill: 0  3. Gastroesophageal reflux disease without esophagitis  - pantoprazole (PROTONIX) 40 MG tablet; Take 1 tablet (40 mg total) by mouth daily.  Dispense: 30 tablet; Refill: 1  4. Coughing up blood  - DG Chest 2 View - [place] - CBC - Comprehensive metabolic panel  Follow up:  Follow up in 3 months or sooner if needed

## 2022-02-16 NOTE — Patient Instructions (Signed)
1. Essential hypertension  - amLODipine (NORVASC) 10 MG tablet; Take 1 tablet (10 mg total) by mouth at bedtime.  Dispense: 90 tablet; Refill: 3 - hydrochlorothiazide (HYDRODIURIL) 25 MG tablet; Take 1 tablet (25 mg total) by mouth daily.  Dispense: 30 tablet; Refill: 2 - losartan (COZAAR) 25 MG tablet; Take 1 tablet (25 mg total) by mouth at bedtime.  Dispense: 90 tablet; Refill: 0  2. ACE-inhibitor cough  - losartan (COZAAR) 25 MG tablet; Take 1 tablet (25 mg total) by mouth at bedtime.  Dispense: 90 tablet; Refill: 0  3. Gastroesophageal reflux disease without esophagitis  - pantoprazole (PROTONIX) 40 MG tablet; Take 1 tablet (40 mg total) by mouth daily.  Dispense: 30 tablet; Refill: 1  4. Coughing up blood  - DG Chest 2 View - [place] - CBC - Comprehensive metabolic panel  Follow up:  Follow up in 3 months or sooner if needed

## 2022-02-16 NOTE — Progress Notes (Signed)
@Patient  ID: David Owen, male    DOB: Oct 22, 1960, 61 y.o.   MRN: [id number]  Chief Complaint  Patient presents with   Hypertension    Pt is here for HTN follow up visit. Pt states when he is home the BP is normal but when he is at work BP is elevated and he spits blood when it is elevated     Referring provider: Vevelyn Francois, NP   HPI  David Owen presents for follow up. He  has a past medical history of Acid reflux, Chronic ischemic heart disease (01/2020), Chronic kidney disease, Constipation, Hemorrhoids, Hyperlipidemia, Hypertension, and Sinus tachycardia by electrocardiogram (01/2020).   Patient presents today for follow-up on hypertension.  His blood pressure is elevated in office today but he states that he did not take his medications today.  Patient complains today of occasionally coughing up blood work.  We will check a chest x-ray.  Patient has not been taking reflux medication.  We will send in a refill for this.  He also has itchy eyes.  We will refill his allergy eyedrops. Denies f/c/s, n/v/d, hemoptysis, PND, leg swelling Denies chest pain or edema     No Known Allergies  Immunization History  Administered Date(s) Administered   Hepatitis A, Adult 04/04/2020   Hepatitis B 01/24/2019   Influenza,inj,Quad PF,6+ Mos 04/04/2020, 04/29/2020, 03/28/2021   MMR 01/24/2019   PFIZER(Purple Top)SARS-COV-2 Vaccination 01/25/2019, 02/29/2020   Td 01/24/2019    Past Medical History:  Diagnosis Date   Acid reflux    Chronic ischemic heart disease 01/2020   Chronic kidney disease    stage III   Constipation    on miralax daily    Hemorrhoids    Hyperlipidemia    Hypertension    Sinus tachycardia by electrocardiogram 01/2020    Tobacco History: Social History   Tobacco Use  Smoking Status Never  Smokeless Tobacco Never   Counseling given: Not Answered   Outpatient Encounter Medications as of 02/16/2022  Medication Sig   acetaminophen (TYLENOL) 500 MG  tablet Take 2 tablets (1,000 mg total) by mouth every 6 (six) hours as needed.   benzonatate (TESSALON) 100 MG capsule Take 1 capsule (100 mg total) by mouth every 8 (eight) hours.   cetirizine (ZYRTEC) 10 MG tablet Take 1 tablet (10 mg total) by mouth daily.   fluticasone (FLONASE) 50 MCG/ACT nasal spray Place 1 spray into both nostrils daily.   hydrocortisone (ANUSOL-HC) 25 MG suppository Place 1 suppository (25 mg total) rectally 2 (two) times daily.   [DISCONTINUED] amLODipine (NORVASC) 10 MG tablet Take 1 tablet (10 mg total) by mouth at bedtime.   [DISCONTINUED] atorvastatin (LIPITOR) 20 MG tablet TAKE 1 TABLET (20 MG TOTAL) BY MOUTH DAILY.   [DISCONTINUED] hydrochlorothiazide (HYDRODIURIL) 25 MG tablet Take 1 tablet (25 mg total) by mouth daily.   [DISCONTINUED] losartan (COZAAR) 25 MG tablet Take 1 tablet (25 mg total) by mouth at bedtime.   amLODipine (NORVASC) 10 MG tablet Take 1 tablet (10 mg total) by mouth at bedtime.   atorvastatin (LIPITOR) 20 MG tablet TAKE 1 TABLET (20 MG TOTAL) BY MOUTH DAILY.   cycloSPORINE (RESTASIS) 0.05 % ophthalmic emulsion Place 1 drop into both eyes 2 (two) times daily. (Patient not taking: Reported on 03/28/2021)   hydrochlorothiazide (HYDRODIURIL) 25 MG tablet Take 1 tablet (25 mg total) by mouth daily.   ibuprofen (ADVIL) 600 MG tablet Take 1 tablet (600 mg total) by mouth every 8 (eight) hours as needed.  losartan (COZAAR) 25 MG tablet Take 1 tablet (25 mg total) by mouth at bedtime.   meloxicam (MOBIC) 7.5 MG tablet Take 1 tablet (7.5 mg total) by mouth daily. (Patient not taking: Reported on 02/16/2022)   olopatadine (PATANOL) 0.1 % ophthalmic solution Place 1 drop into both eyes 2 (two) times daily.   pantoprazole (PROTONIX) 40 MG tablet Take 1 tablet (40 mg total) by mouth daily.   sildenafil (VIAGRA) 50 MG tablet Take 1 tablet (50 mg total) by mouth daily as needed for erectile dysfunction. (Patient not taking: Reported on 02/16/2022)   sildenafil  (VIAGRA) 50 MG tablet TAKE 1 TABLET (50 MG TOTAL) BY MOUTH DAILY AS NEEDED FOR ERECTILE DYSFUNCTION.   tiZANidine (ZANAFLEX) 4 MG tablet Take 1 tablet (4 mg total) by mouth every 6 (six) hours as needed for muscle spasms. (Patient not taking: Reported on 02/16/2022)   [DISCONTINUED] amLODipine (NORVASC) 10 MG tablet TAKE 1 TABLET (10 MG TOTAL) BY MOUTH AT BEDTIME.   [DISCONTINUED] hydrochlorothiazide (HYDRODIURIL) 25 MG tablet TAKE 1 TABLET (25 MG TOTAL) BY MOUTH DAILY.   [DISCONTINUED] olopatadine (PATANOL) 0.1 % ophthalmic solution Place 1 drop into both eyes 2 (two) times daily. (Patient not taking: Reported on 02/16/2022)   [DISCONTINUED] pantoprazole (PROTONIX) 40 MG tablet Take 1 tablet (40 mg total) by mouth daily. (Patient not taking: Reported on 02/16/2022)   No facility-administered encounter medications on file as of 02/16/2022.     Review of Systems  Review of Systems  Constitutional: Negative.   HENT: Negative.    Cardiovascular: Negative.   Gastrointestinal: Negative.   Allergic/Immunologic: Negative.   Neurological: Negative.   Psychiatric/Behavioral: Negative.         Physical Exam  BP (!) 143/86 (BP Location: Right Arm, Patient Position: Sitting, Cuff Size: Large)   Pulse 73   Temp 98.3 F (36.8 C)   Ht 5' 5"  (1.651 m)   Wt 181 lb (82.1 kg)   SpO2 99%   BMI 30.12 kg/m   Wt Readings from Last 5 Encounters:  02/16/22 181 lb (82.1 kg)  08/14/21 179 lb 6 oz (81.4 kg)  03/28/21 178 lb (80.7 kg)  08/05/20 181 lb (82.1 kg)  07/01/20 185 lb 3.2 oz (84 kg)     Physical Exam Vitals and nursing note reviewed.  Constitutional:      General: He is not in acute distress.    Appearance: He is well-developed.  Cardiovascular:     Rate and Rhythm: Normal rate and regular rhythm.  Pulmonary:     Effort: Pulmonary effort is normal.     Breath sounds: Normal breath sounds.  Skin:    General: Skin is warm and dry.  Neurological:     Mental Status: He is alert and  oriented to person, place, and time.      Lab Results:  CBC    Component Value Date/Time   WBC 5.9 08/14/2021 1007   WBC 8.3 02/15/2020 1321   RBC 5.47 08/14/2021 1007   RBC 5.04 02/15/2020 1321   HGB 15.7 08/14/2021 1007   HCT 47.5 08/14/2021 1007   PLT 176 08/14/2021 1007   MCV 87 08/14/2021 1007   MCH 28.7 08/14/2021 1007   MCH 28.8 02/15/2020 1321   MCHC 33.1 08/14/2021 1007   MCHC 33.4 02/15/2020 1321   RDW 13.5 08/14/2021 1007   LYMPHSABS 1.8 08/05/2020 1135   EOSABS 0.2 08/05/2020 1135   BASOSABS 0.1 08/05/2020 1135    BMET    Component Value Date/Time  NA 143 08/14/2021 1007   K 3.8 08/14/2021 1007   CL 106 08/14/2021 1007   CO2 23 08/14/2021 1007   GLUCOSE 104 (H) 08/14/2021 1007   GLUCOSE 152 (H) 02/15/2020 1321   BUN 16 08/14/2021 1007   CREATININE 1.16 08/14/2021 1007   CALCIUM 9.6 08/14/2021 1007   GFRNONAA 62 06/05/2020 1118   GFRAA 72 06/05/2020 1118     Assessment & Plan:   Essential hypertension - amLODipine (NORVASC) 10 MG tablet; Take 1 tablet (10 mg total) by mouth at bedtime.  Dispense: 90 tablet; Refill: 3 - hydrochlorothiazide (HYDRODIURIL) 25 MG tablet; Take 1 tablet (25 mg total) by mouth daily.  Dispense: 30 tablet; Refill: 2 - losartan (COZAAR) 25 MG tablet; Take 1 tablet (25 mg total) by mouth at bedtime.  Dispense: 90 tablet; Refill: 0  2. ACE-inhibitor cough  - losartan (COZAAR) 25 MG tablet; Take 1 tablet (25 mg total) by mouth at bedtime.  Dispense: 90 tablet; Refill: 0  3. Gastroesophageal reflux disease without esophagitis  - pantoprazole (PROTONIX) 40 MG tablet; Take 1 tablet (40 mg total) by mouth daily.  Dispense: 30 tablet; Refill: 1  4. Coughing up blood  - DG Chest 2 View - [place] - CBC - Comprehensive metabolic panel  Follow up:  Follow up in 3 months or sooner if needed     Fenton Foy, NP 02/16/2022

## 2022-02-17 LAB — COMPREHENSIVE METABOLIC PANEL
ALT: 30 IU/L (ref 0–44)
AST: 20 IU/L (ref 0–40)
Albumin/Globulin Ratio: 2 (ref 1.2–2.2)
Albumin: 4.8 g/dL (ref 3.9–4.9)
Alkaline Phosphatase: 106 IU/L (ref 44–121)
BUN/Creatinine Ratio: 14 (ref 10–24)
BUN: 17 mg/dL (ref 8–27)
Bilirubin Total: 0.4 mg/dL (ref 0.0–1.2)
CO2: 23 mmol/L (ref 20–29)
Calcium: 10 mg/dL (ref 8.6–10.2)
Chloride: 103 mmol/L (ref 96–106)
Creatinine, Ser: 1.18 mg/dL (ref 0.76–1.27)
Globulin, Total: 2.4 g/dL (ref 1.5–4.5)
Glucose: 114 mg/dL — ABNORMAL HIGH (ref 70–99)
Potassium: 3.5 mmol/L (ref 3.5–5.2)
Sodium: 145 mmol/L — ABNORMAL HIGH (ref 134–144)
Total Protein: 7.2 g/dL (ref 6.0–8.5)
eGFR: 70 mL/min/{1.73_m2} (ref >59)

## 2022-02-17 LAB — CBC
Hematocrit: 47.6 % (ref 37.5–51.0)
Hemoglobin: 15.4 g/dL (ref 13.0–17.7)
MCH: 28.5 pg (ref 26.6–33.0)
MCHC: 32.4 g/dL (ref 31.5–35.7)
MCV: 88 fL (ref 79–97)
Platelets: 180 10*3/uL (ref 150–450)
RBC: 5.41 x10E6/uL (ref 4.14–5.80)
RDW: 14 % (ref 11.6–15.4)
WBC: 7.6 10*3/uL (ref 3.4–10.8)

## 2022-03-18 ENCOUNTER — Other Ambulatory Visit: Payer: Self-pay

## 2022-03-19 ENCOUNTER — Other Ambulatory Visit: Payer: Self-pay

## 2022-05-08 ENCOUNTER — Other Ambulatory Visit: Payer: Self-pay

## 2022-05-21 ENCOUNTER — Telehealth: Payer: Self-pay

## 2022-05-21 ENCOUNTER — Ambulatory Visit: Payer: Self-pay | Admitting: Nurse Practitioner

## 2022-05-21 NOTE — Telephone Encounter (Signed)
Transition Care Management Unsuccessful Follow-up Telephone Call  Date of discharge and from where:  05/17/22  Attempts:  1st Attempt  Reason for unsuccessful TCM follow-up call:  Unable to leave message Elyse Jarvis RMA

## 2022-06-01 ENCOUNTER — Ambulatory Visit (INDEPENDENT_AMBULATORY_CARE_PROVIDER_SITE_OTHER): Payer: BC Managed Care – PPO | Admitting: Nurse Practitioner

## 2022-06-01 ENCOUNTER — Other Ambulatory Visit: Payer: Self-pay

## 2022-06-01 ENCOUNTER — Encounter: Payer: Self-pay | Admitting: Nurse Practitioner

## 2022-06-01 DIAGNOSIS — I10 Essential (primary) hypertension: Secondary | ICD-10-CM

## 2022-06-01 DIAGNOSIS — T464X5A Adverse effect of angiotensin-converting-enzyme inhibitors, initial encounter: Secondary | ICD-10-CM

## 2022-06-01 DIAGNOSIS — R058 Other specified cough: Secondary | ICD-10-CM

## 2022-06-01 MED ORDER — AMLODIPINE BESYLATE 10 MG PO TABS
10.0000 mg | ORAL_TABLET | Freq: Every day | ORAL | 3 refills | Status: DC
  Filled 2022-06-01: qty 90, 90d supply, fill #0

## 2022-06-01 MED ORDER — ATORVASTATIN CALCIUM 20 MG PO TABS
20.0000 mg | ORAL_TABLET | Freq: Every day | ORAL | 0 refills | Status: DC
  Filled 2022-06-01: qty 90, 90d supply, fill #0

## 2022-06-01 MED ORDER — LOSARTAN POTASSIUM 25 MG PO TABS
25.0000 mg | ORAL_TABLET | Freq: Every day | ORAL | 0 refills | Status: DC
  Filled 2022-06-01: qty 90, 90d supply, fill #0

## 2022-06-01 MED ORDER — HYDROCORTISONE (PERIANAL) 2.5 % EX CREA
1.0000 | TOPICAL_CREAM | Freq: Two times a day (BID) | CUTANEOUS | 0 refills | Status: DC
  Filled 2022-06-01: qty 30, 30d supply, fill #0

## 2022-06-01 NOTE — Progress Notes (Signed)
$'@Patient'a$  ID: David Owen, male    DOB: 1961/02/04, 62 y.o.   MRN: [id number]  Chief Complaint  Patient presents with   Gastroesophageal Reflux   Hypertension    He had an accident May 13, 2022 on job and he fractured his left ankle. He has back pain. Ortho appt is tomorrow. The dressing is coming off. He is having a hard time walking.     Referring provider: Vevelyn Francois, NP   HPI  David Owen presents for follow up. He  has a past medical history of Acid reflux, Chronic ischemic heart disease (01/2020), Chronic kidney disease, Constipation, Hemorrhoids, Hyperlipidemia, Hypertension, and Sinus tachycardia by electrocardiogram (01/2020).    Patient presents today for a follow-up visit.  His blood pressure remains elevated and he states that when he takes it at home it is elevated as well.  We will refer him to pharmacist for medication management.  Patient may need to have a hypertension specialist referral if this continues to be an issue.  Patient states that he does need a referral for an eye doctor.  Will give him the list of eye doctors that he can choose from.  Patient's daughter is at the visit with him today and speaks [place] but a [place] interpreter is used today as well.  Patient did recently fractured his ankle and does have a wrap/cast to the left ankle.  He is walking with crutches today.  He does have a follow-up with Ortho tomorrow. Denies f/c/s, n/v/d, hemoptysis, PND, leg swelling Denies chest pain or edema    No Known Allergies  Immunization History  Administered Date(s) Administered   Hepatitis A, Adult 04/04/2020   Hepatitis B 01/24/2019   Influenza,inj,Quad PF,6+ Mos 04/04/2020, 04/29/2020, 03/28/2021   MMR 01/24/2019   PFIZER(Purple Top)SARS-COV-2 Vaccination 01/25/2019, 02/29/2020   Td 01/24/2019    Past Medical History:  Diagnosis Date   Acid reflux    Chronic ischemic heart disease 01/2020   Chronic kidney disease    stage III    Constipation    on miralax daily    Hemorrhoids    Hyperlipidemia    Hypertension    Sinus tachycardia by electrocardiogram 01/2020    Tobacco History: Social History   Tobacco Use  Smoking Status Never  Smokeless Tobacco Never   Counseling given: Not Answered   Outpatient Encounter Medications as of 06/01/2022  Medication Sig   acetaminophen (TYLENOL) 500 MG tablet Take 2 tablets (1,000 mg total) by mouth every 6 (six) hours as needed.   cycloSPORINE (RESTASIS) 0.05 % ophthalmic emulsion Place 1 drop into both eyes 2 (two) times daily.   hydrochlorothiazide (HYDRODIURIL) 25 MG tablet Take 1 tablet (25 mg total) by mouth daily.   hydrocortisone (ANUSOL-HC) 2.5 % rectal cream Place 1 Application rectally 2 (two) times daily.   ibuprofen (ADVIL) 600 MG tablet Take 1 tablet (600 mg total) by mouth every 8 (eight) hours as needed.   olopatadine (PATANOL) 0.1 % ophthalmic solution Place 1 drop into both eyes 2 (two) times daily.   [DISCONTINUED] amLODipine (NORVASC) 10 MG tablet Take 1 tablet (10 mg total) by mouth at bedtime.   [DISCONTINUED] atorvastatin (LIPITOR) 20 MG tablet TAKE 1 TABLET (20 MG TOTAL) BY MOUTH DAILY.   [DISCONTINUED] losartan (COZAAR) 25 MG tablet Take 1 tablet (25 mg total) by mouth at bedtime.   amLODipine (NORVASC) 10 MG tablet Take 1 tablet (10 mg total) by mouth at bedtime.   atorvastatin (LIPITOR) 20 MG  tablet Take 1 tablet (20 mg total) by mouth daily.   benzonatate (TESSALON) 100 MG capsule Take 1 capsule (100 mg total) by mouth every 8 (eight) hours. (Patient not taking: Reported on 06/01/2022)   cetirizine (ZYRTEC) 10 MG tablet Take 1 tablet (10 mg total) by mouth daily. (Patient not taking: Reported on 06/01/2022)   fluticasone (FLONASE) 50 MCG/ACT nasal spray Place 1 spray into both nostrils daily. (Patient not taking: Reported on 06/01/2022)   hydrocortisone (ANUSOL-HC) 25 MG suppository Place 1 suppository (25 mg total) rectally 2 (two) times daily.  (Patient not taking: Reported on 06/01/2022)   losartan (COZAAR) 25 MG tablet Take 1 tablet (25 mg total) by mouth at bedtime.   meloxicam (MOBIC) 7.5 MG tablet Take 1 tablet (7.5 mg total) by mouth daily. (Patient not taking: Reported on 02/16/2022)   pantoprazole (PROTONIX) 40 MG tablet Take 1 tablet (40 mg total) by mouth daily. (Patient not taking: Reported on 06/01/2022)   sildenafil (VIAGRA) 50 MG tablet Take 1 tablet (50 mg total) by mouth daily as needed for erectile dysfunction. (Patient not taking: Reported on 02/16/2022)   sildenafil (VIAGRA) 50 MG tablet TAKE 1 TABLET (50 MG TOTAL) BY MOUTH DAILY AS NEEDED FOR ERECTILE DYSFUNCTION.   tiZANidine (ZANAFLEX) 4 MG tablet Take 1 tablet (4 mg total) by mouth every 6 (six) hours as needed for muscle spasms. (Patient not taking: Reported on 02/16/2022)   No facility-administered encounter medications on file as of 06/01/2022.     Review of Systems  Review of Systems  Constitutional: Negative.   HENT: Negative.    Cardiovascular: Negative.   Gastrointestinal: Negative.   Allergic/Immunologic: Negative.   Neurological: Negative.   Psychiatric/Behavioral: Negative.         Physical Exam  BP (!) 152/85   Pulse 85   Temp 99.3 F (37.4 C) (Temporal)   Ht '5\' 3"'$  (1.6 m)   Wt 193 lb 3.2 oz (87.6 kg)   SpO2 100%   BMI 34.22 kg/m   Wt Readings from Last 5 Encounters:  06/01/22 193 lb 3.2 oz (87.6 kg)  02/16/22 181 lb (82.1 kg)  08/14/21 179 lb 6 oz (81.4 kg)  03/28/21 178 lb (80.7 kg)  08/05/20 181 lb (82.1 kg)     Physical Exam Vitals and nursing note reviewed.  Constitutional:      General: He is not in acute distress.    Appearance: He is well-developed.  Cardiovascular:     Rate and Rhythm: Normal rate and regular rhythm.  Pulmonary:     Effort: Pulmonary effort is normal.     Breath sounds: Normal breath sounds.  Musculoskeletal:     Comments: Left ankle with cast / wrap intact  Skin:    General: Skin is warm and  dry.  Neurological:     Mental Status: He is alert and oriented to person, place, and time.      Lab Results:  CBC    Component Value Date/Time   WBC 7.6 02/16/2022 1105   WBC 8.3 02/15/2020 1321   RBC 5.41 02/16/2022 1105   RBC 5.04 02/15/2020 1321   HGB 15.4 02/16/2022 1105   HCT 47.6 02/16/2022 1105   PLT 180 02/16/2022 1105   MCV 88 02/16/2022 1105   MCH 28.5 02/16/2022 1105   MCH 28.8 02/15/2020 1321   MCHC 32.4 02/16/2022 1105   MCHC 33.4 02/15/2020 1321   RDW 14.0 02/16/2022 1105   LYMPHSABS 1.8 08/05/2020 1135   EOSABS 0.2 08/05/2020 1135  BASOSABS 0.1 08/05/2020 1135    BMET    Component Value Date/Time   NA 145 (H) 02/16/2022 1105   K 3.5 02/16/2022 1105   CL 103 02/16/2022 1105   CO2 23 02/16/2022 1105   GLUCOSE 114 (H) 02/16/2022 1105   GLUCOSE 152 (H) 02/15/2020 1321   BUN 17 02/16/2022 1105   CREATININE 1.18 02/16/2022 1105   CALCIUM 10.0 02/16/2022 1105   GFRNONAA 62 06/05/2020 1118   GFRAA 72 06/05/2020 1118      Assessment & Plan:   Essential hypertension - amLODipine (NORVASC) 10 MG tablet; Take 1 tablet (10 mg total) by mouth at bedtime.  Dispense: 90 tablet; Refill: 3 - losartan (COZAAR) 25 MG tablet; Take 1 tablet (25 mg total) by mouth at bedtime.  Dispense: 90 tablet; Refill: 0 - AMB Referral to Pharmacy Medication Management  2. ACE-inhibitor cough  - losartan (COZAAR) 25 MG tablet; Take 1 tablet (25 mg total) by mouth at bedtime.  Dispense: 90 tablet; Refill: 0  Follow up:  Follow up in 6 months     Fenton Foy, NP 06/01/2022

## 2022-06-01 NOTE — Patient Instructions (Addendum)
1. Essential hypertension  - amLODipine (NORVASC) 10 MG tablet; Take 1 tablet (10 mg total) by mouth at bedtime.  Dispense: 90 tablet; Refill: 3 - losartan (COZAAR) 25 MG tablet; Take 1 tablet (25 mg total) by mouth at bedtime.  Dispense: 90 tablet; Refill: 0 - AMB Referral to Pharmacy Medication Management  2. ACE-inhibitor cough  - losartan (COZAAR) 25 MG tablet; Take 1 tablet (25 mg total) by mouth at bedtime.  Dispense: 90 tablet; Refill: 0  Follow up:  Follow up in 6 months

## 2022-06-01 NOTE — Assessment & Plan Note (Signed)
-  amLODipine (NORVASC) 10 MG tablet; Take 1 tablet (10 mg total) by mouth at bedtime.  Dispense: 90 tablet; Refill: 3 - losartan (COZAAR) 25 MG tablet; Take 1 tablet (25 mg total) by mouth at bedtime.  Dispense: 90 tablet; Refill: 0 - AMB Referral to Pharmacy Medication Management  2. ACE-inhibitor cough  - losartan (COZAAR) 25 MG tablet; Take 1 tablet (25 mg total) by mouth at bedtime.  Dispense: 90 tablet; Refill: 0  Follow up:  Follow up in 6 months

## 2022-06-02 ENCOUNTER — Telehealth: Payer: Self-pay | Admitting: Pharmacist

## 2022-06-02 NOTE — Progress Notes (Signed)
Contacted patient regarding referral for hypertension from Fenton Foy, NP .   Appointment scheduled   Catie Hedwig Morton, PharmD, [facility] [phone]

## 2022-06-11 ENCOUNTER — Telehealth: Payer: Self-pay

## 2022-06-11 NOTE — Telephone Encounter (Signed)
Error

## 2022-06-18 ENCOUNTER — Telehealth: Payer: Self-pay

## 2022-06-18 NOTE — Telephone Encounter (Signed)
Pt was advised of the need to have an appt. Pt was scheduled 06/25/22 at 8 am. [facility]

## 2022-06-25 ENCOUNTER — Encounter: Payer: Self-pay | Admitting: Nurse Practitioner

## 2022-06-25 ENCOUNTER — Other Ambulatory Visit: Payer: Self-pay

## 2022-06-25 ENCOUNTER — Other Ambulatory Visit: Payer: BC Managed Care – PPO | Admitting: Pharmacist

## 2022-06-25 ENCOUNTER — Ambulatory Visit: Payer: BC Managed Care – PPO | Admitting: Nurse Practitioner

## 2022-06-25 ENCOUNTER — Encounter: Payer: Self-pay | Admitting: Interventional Cardiology

## 2022-06-25 VITALS — BP 161/83 | HR 91 | Temp 98.6°F | Ht 63.0 in | Wt 192.6 lb

## 2022-06-25 DIAGNOSIS — Z01818 Encounter for other preprocedural examination: Secondary | ICD-10-CM

## 2022-06-25 DIAGNOSIS — I10 Essential (primary) hypertension: Secondary | ICD-10-CM

## 2022-06-25 DIAGNOSIS — T464X5A Adverse effect of angiotensin-converting-enzyme inhibitors, initial encounter: Secondary | ICD-10-CM

## 2022-06-25 DIAGNOSIS — R9431 Abnormal electrocardiogram [ECG] [EKG]: Secondary | ICD-10-CM | POA: Diagnosis not present

## 2022-06-25 DIAGNOSIS — R058 Other specified cough: Secondary | ICD-10-CM

## 2022-06-25 DIAGNOSIS — Z87898 Personal history of other specified conditions: Secondary | ICD-10-CM | POA: Diagnosis not present

## 2022-06-25 MED ORDER — LOSARTAN POTASSIUM 25 MG PO TABS
25.0000 mg | ORAL_TABLET | Freq: Every day | ORAL | 0 refills | Status: DC
  Filled 2022-06-25: qty 90, 90d supply, fill #0

## 2022-06-25 MED ORDER — CLONIDINE HCL 0.1 MG PO TABS
0.1000 mg | ORAL_TABLET | Freq: Three times a day (TID) | ORAL | 3 refills | Status: DC
  Filled 2022-06-25: qty 90, 30d supply, fill #0

## 2022-06-25 MED ORDER — HYDROCHLOROTHIAZIDE 25 MG PO TABS
25.0000 mg | ORAL_TABLET | Freq: Every day | ORAL | 2 refills | Status: DC
  Filled 2022-06-25: qty 30, 30d supply, fill #0
  Filled 2022-07-28: qty 30, 30d supply, fill #1

## 2022-06-25 MED ORDER — CLONIDINE HCL 0.1 MG PO TABS
0.1000 mg | ORAL_TABLET | Freq: Once | ORAL | Status: AC
  Administered 2022-06-25: 0.1 mg via ORAL

## 2022-06-25 MED ORDER — BLOOD PRESSURE KIT
1.0000 [IU] | PACK | Freq: Every day | 0 refills | Status: AC
  Filled 2022-06-25: qty 1, fill #0

## 2022-06-25 NOTE — Progress Notes (Signed)
Subjective:    David Owen is a 62 y.o. male who presents to the office today for a preoperative consultation at the request of surgeon [facility] who plans on performing ORIF left ankle on February 2024. Planned anesthesia:  unknown . The patient has the following known anesthesia issues:  states having difficulty waking up after previous surgeries . Patients bleeding risk: use of Ca-channel blockers (see med list). Patient does not have objections to receiving blood products if needed.  Note: blood pressure was elevated in office, patient did not take blood pressure medication today.  [id number] - will refer to cardiology for clearance   Review of Systems  Constitutional: Negative.   HENT: Negative.    Eyes: Negative.   Respiratory: Negative.    Cardiovascular: Negative.   Gastrointestinal: Negative.   Genitourinary: Negative.   Musculoskeletal: Negative.   Skin: Negative.   Neurological: Negative.   Endo/Heme/Allergies: Negative.   Psychiatric/Behavioral: Negative.        Objective:     Physical Exam Constitutional:      General: He is not in acute distress. Cardiovascular:     Rate and Rhythm: Normal rate and regular rhythm.  Pulmonary:     Effort: Pulmonary effort is normal.     Breath sounds: Normal breath sounds.  Skin:    General: Skin is warm and dry.  Neurological:     Mental Status: He is alert and oriented to person, place, and time.  Psychiatric:        Mood and Affect: Affect normal.      Assessment:         63 y.o. male with planned surgery as above.   Known risk factors for perioperative complications:  uncontrolled hypertension     Preoperative workup as follows chest x-ray, ECG.  Patient is average risk for surgical complications per ACS risk calculator  Major Pulmonary risks identified in the multifactorial risk analysis are but not limited to a) pneumonia; b) recurrent intubation risk; c) prolonged or recurrent acute  respiratory failure needing mechanical ventilation; d) prolonged hospitalization; e) DVT/Pulmonary embolism; f) Acute Pulmonary edema  Recommend 1. Short duration of surgery as much as possible and avoid paralytic if possible 2. Recovery in step down or ICU with Pulmonary consultation 3. DVT prophylaxis 4. Aggressive pulmonary toilet with O2, bronchodilatation, and incentive spirometry and early ambulation    Plan:   Essential hypertension - EKG 12-Lead - cloNIDine (CATAPRES) tablet 0.1 mg - Recheck vitals - Ambulatory referral to Cardiology - hydrochlorothiazide (HYDRODIURIL) 25 MG tablet; Take 1 tablet (25 mg total) by mouth daily.  Dispense: 30 tablet; Refill: 2 - losartan (COZAAR) 25 MG tablet; Take 1 tablet (25 mg total) by mouth at bedtime.  Dispense: 90 tablet; Refill: 0 - Blood Pressure KIT; 1 Units by Does not apply route daily.  Dispense: 1 kit; Refill: 0  2. Abnormal EKG  - Ambulatory referral to Cardiology  3. Preoperative clearance  - Ambulatory referral to Cardiology  4. History of hemoptysis  - DG Chest 2 View  5. ACE-inhibitor cough  - losartan (COZAAR) 25 MG tablet; Take 1 tablet (25 mg total) by mouth at bedtime.  Dispense: 90 tablet; Refill: 0  Follow up:  Follow up for nurse visit next Tuesday

## 2022-06-25 NOTE — Patient Instructions (Addendum)
1. Essential hypertension  - EKG 12-Lead - cloNIDine (CATAPRES) tablet 0.1 mg - Recheck vitals - Ambulatory referral to Cardiology - hydrochlorothiazide (HYDRODIURIL) 25 MG tablet; Take 1 tablet (25 mg total) by mouth daily.  Dispense: 30 tablet; Refill: 2 - losartan (COZAAR) 25 MG tablet; Take 1 tablet (25 mg total) by mouth at bedtime.  Dispense: 90 tablet; Refill: 0 - Blood Pressure KIT; 1 Units by Does not apply route daily.  Dispense: 1 kit; Refill: 0  2. Abnormal EKG  - Ambulatory referral to Cardiology  3. Preoperative clearance  - Ambulatory referral to Cardiology  4. History of hemoptysis  - DG Chest 2 View  5. ACE-inhibitor cough  - losartan (COZAAR) 25 MG tablet; Take 1 tablet (25 mg total) by mouth at bedtime.  Dispense: 90 tablet; Refill: 0  Follow up:  Follow up for nurse visit next Tuesday

## 2022-06-25 NOTE — Assessment & Plan Note (Signed)
-   EKG 12-Lead - cloNIDine (CATAPRES) tablet 0.1 mg - Recheck vitals - Ambulatory referral to Cardiology - hydrochlorothiazide (HYDRODIURIL) 25 MG tablet; Take 1 tablet (25 mg total) by mouth daily.  Dispense: 30 tablet; Refill: 2 - losartan (COZAAR) 25 MG tablet; Take 1 tablet (25 mg total) by mouth at bedtime.  Dispense: 90 tablet; Refill: 0 - Blood Pressure KIT; 1 Units by Does not apply route daily.  Dispense: 1 kit; Refill: 0  2. Abnormal EKG  - Ambulatory referral to Cardiology  3. Preoperative clearance  - Ambulatory referral to Cardiology  4. History of hemoptysis  - DG Chest 2 View  5. ACE-inhibitor cough  - losartan (COZAAR) 25 MG tablet; Take 1 tablet (25 mg total) by mouth at bedtime.  Dispense: 90 tablet; Refill: 0  Follow up:  Follow up for nurse visit next Tuesday

## 2022-06-25 NOTE — Progress Notes (Signed)
Care Coordination Call  Patient seen in office today. BP elevated, per Kenney Houseman he did not take medications this morning. HCTZ not filled since Dec. Tonya to refill HCTZ, patient will take medications. Phone call follow up with me next week, he will be scheduled for nurse visit for BP recheck.   Catie Hedwig Morton, PharmD, [place], [facility] [phone]

## 2022-06-30 ENCOUNTER — Ambulatory Visit: Payer: BC Managed Care – PPO

## 2022-06-30 VITALS — BP 152/78 | HR 100 | Ht 63.0 in | Wt 193.9 lb

## 2022-06-30 DIAGNOSIS — I10 Essential (primary) hypertension: Secondary | ICD-10-CM | POA: Diagnosis not present

## 2022-06-30 NOTE — Progress Notes (Signed)
Blood Pressure Recheck Visit  Name: David Owen MRN: [id number] Date of Birth: Oct 02, 1960  David Owen presents today for Blood Pressure recheck with clinical support staff.  Order for BP recheck by Lazaro Arms, ordered on 06/25/22 .   BP Readings from Last 3 Encounters:  06/30/22 (!) 164/78  06/25/22 (!) 161/83  06/01/22 (!) 152/85    Current Outpatient Medications  Medication Sig Dispense Refill   acetaminophen (TYLENOL) 500 MG tablet Take 2 tablets (1,000 mg total) by mouth every 6 (six) hours as needed. 30 tablet 0   amLODipine (NORVASC) 10 MG tablet Take 1 tablet (10 mg total) by mouth at bedtime. 90 tablet 3   atorvastatin (LIPITOR) 20 MG tablet Take 1 tablet (20 mg total) by mouth daily. 90 tablet 0   benzonatate (TESSALON) 100 MG capsule Take 1 capsule (100 mg total) by mouth every 8 (eight) hours. 21 capsule 0   Blood Pressure KIT 1 Units by Does not apply route daily. 1 kit 0   cetirizine (ZYRTEC) 10 MG tablet Take 1 tablet (10 mg total) by mouth daily. (Patient not taking: Reported on 06/01/2022) 30 tablet 2   cycloSPORINE (RESTASIS) 0.05 % ophthalmic emulsion Place 1 drop into both eyes 2 (two) times daily. 0.4 mL 1   fluticasone (FLONASE) 50 MCG/ACT nasal spray Place 1 spray into both nostrils daily. 16 g 2   hydrochlorothiazide (HYDRODIURIL) 25 MG tablet Take 1 tablet (25 mg total) by mouth daily. 30 tablet 2   hydrocortisone (ANUSOL-HC) 2.5 % rectal cream Place 1 Application rectally 2 (two) times daily. 30 g 0   hydrocortisone (ANUSOL-HC) 25 MG suppository Place 1 suppository (25 mg total) rectally 2 (two) times daily. (Patient not taking: Reported on 06/01/2022) 12 suppository 0   ibuprofen (ADVIL) 600 MG tablet Take 1 tablet (600 mg total) by mouth every 8 (eight) hours as needed. 30 tablet 0   losartan (COZAAR) 25 MG tablet Take 1 tablet (25 mg total) by mouth at bedtime. 90 tablet 0   meloxicam (MOBIC) 7.5 MG tablet Take 1 tablet (7.5 mg total) by mouth  daily. 30 tablet 0   olopatadine (PATANOL) 0.1 % ophthalmic solution Place 1 drop into both eyes 2 (two) times daily. (Patient not taking: Reported on 06/25/2022) 5 mL 12   pantoprazole (PROTONIX) 40 MG tablet Take 1 tablet (40 mg total) by mouth daily. (Patient not taking: Reported on 06/01/2022) 30 tablet 1   sildenafil (VIAGRA) 50 MG tablet Take 1 tablet (50 mg total) by mouth daily as needed for erectile dysfunction. (Patient not taking: Reported on 02/16/2022) 10 tablet 1   sildenafil (VIAGRA) 50 MG tablet TAKE 1 TABLET (50 MG TOTAL) BY MOUTH DAILY AS NEEDED FOR ERECTILE DYSFUNCTION. 10 tablet 1   tiZANidine (ZANAFLEX) 4 MG tablet Take 1 tablet (4 mg total) by mouth every 6 (six) hours as needed for muscle spasms. 30 tablet 0   No current facility-administered medications for this visit.    Hypertensive Medication Review: Patient states that they are taking all their hypertensive medications as prescribed and their last dose of hypertensive medications was this morning   Documentation of any medication adherence discrepancies: none  Provider Recommendation:  Spoke to David Owen and they stated: that he is taking his medication.  Patient has been scheduled to follow up with David Owen.   Patient has been given provider's recommendations and does not have any questions or concerns at this time. Patient will contact the office for  any future questions or concerns.    [name]

## 2022-07-01 ENCOUNTER — Other Ambulatory Visit: Payer: Self-pay

## 2022-07-01 ENCOUNTER — Other Ambulatory Visit (HOSPITAL_BASED_OUTPATIENT_CLINIC_OR_DEPARTMENT_OTHER): Payer: Self-pay

## 2022-07-01 ENCOUNTER — Other Ambulatory Visit: Payer: Self-pay | Admitting: Nurse Practitioner

## 2022-07-01 MED ORDER — HYDROCORTISONE (PERIANAL) 2.5 % EX CREA
1.0000 | TOPICAL_CREAM | Freq: Two times a day (BID) | CUTANEOUS | 0 refills | Status: DC
  Filled 2022-07-01: qty 30, 10d supply, fill #0
  Filled 2022-07-01: qty 28, 9d supply, fill #0

## 2022-07-02 ENCOUNTER — Telehealth: Payer: Self-pay | Admitting: Licensed Clinical Social Worker

## 2022-07-02 ENCOUNTER — Other Ambulatory Visit: Payer: BC Managed Care – PPO | Admitting: Pharmacist

## 2022-07-02 ENCOUNTER — Other Ambulatory Visit: Payer: Self-pay

## 2022-07-02 MED ORDER — VALSARTAN 80 MG PO TABS
80.0000 mg | ORAL_TABLET | Freq: Every day | ORAL | 1 refills | Status: DC
  Filled 2022-07-02: qty 90, 90d supply, fill #0

## 2022-07-02 NOTE — Telephone Encounter (Addendum)
H&V Care Navigation CSW Progress Note  Clinical Social Worker contacted patient by phone to f/u on ride request for upcoming appt with Dr. Irish Lack at [facility] office. I was able to reach him by telephone [phone] with assistance of [employer] [place] [phone]. Introduced self, role, reason for call. Pt confirmed that normally his son takes him to appts, he thinks he can get him there at that time for his appt at [facility]. I shared that there are additional resources that may be available to him if needed to get to appt. He is interested in me sending him these and shares his daughter is able to read [place] and assist him. LCSW will mail these and my card. Encouraged them to contact me at least 24 hours in advance if pt son cannot take him to an appt at that time.   No additional questions/concerns at this time, I remain available as needed.   Patient is participating in a Managed Medicaid Plan:  No, BCBS only  [place]   Transportation Needs: Unmet Transportation Needs (07/02/2022)  Depression (PHQ2-9): Low Risk  (06/25/2022)  Tobacco Use: Low Risk  (06/25/2022)   Westley Hummer, MSW, [place]  [phone]- work cell phone (preferred) [phone]- desk phone

## 2022-07-02 NOTE — Progress Notes (Signed)
07/02/2022 Name: David Owen MRN: [id number] DOB: 1961-03-02  Chief Complaint  Patient presents with   Medication Management   Hypertension    David Owen is a 62 y.o. year old male who presented for a telephone visit.   They were referred to the pharmacist by their PCP for assistance in managing hypertension.   Called patient with interpreter [place] [place]  Subjective:  Care Team: Primary Care Provider: Fenton Foy, NP ; Next Scheduled Visit: 11/30/22 Cardiologist: Irish Lack; Next Scheduled Visit: 07/15/22  Medication Access/Adherence  Current Pharmacy:  [facility], [place] [address] Phone: [phone] Fax: [phone]   Patient reports affordability concerns with their medications: No  Patient reports access/transportation concerns to their pharmacy: No  Patient reports adherence concerns with their medications:  No     Hypertension:  Current medications: amlodipine 10 mg daily, HCTZ 25 mg daily, losartan 25 mg daily  Patient has an automated, upper arm home BP cuff Current blood pressure readings readings: 140/70-80  Patient denies hypotensive s/sx including dizziness, lightheadedness.  Patient denies hypertensive symptoms including headache, chest pain, but does endorse shortness of breath when sleeping or sitting, not when exerting.    Hyperlipidemia/ASCVD Risk Reduction  Current lipid lowering medications: atorvastatin 20 mg daily  Objective:   Lab Results  Component Value Date   CREATININE 1.18 02/16/2022   BUN 17 02/16/2022   NA 145 (H) 02/16/2022   K 3.5 02/16/2022   CL 103 02/16/2022   CO2 23 02/16/2022    Lab Results  Component Value Date   CHOL 174 03/28/2021   HDL 41 03/28/2021   LDLCALC 103 (H) 03/28/2021   LDLDIRECT 100 (H) 02/28/2020   TRIG 174 (H) 03/28/2021   CHOLHDL 4.2 03/28/2021    Medications Reviewed Today     Reviewed by  Osker Mason, RPH-CPP (Pharmacist) on 07/02/22 at [facility] Status: <None>   Medication Order Taking? Sig Documenting Provider Last Dose Status Informant  acetaminophen (TYLENOL) 500 MG tablet [id number]  Take 2 tablets (1,000 mg total) by mouth every 6 (six) hours as needed. Talbot Grumbling, FNP  Active   amLODipine (NORVASC) 10 MG tablet [id number] Yes Take 1 tablet (10 mg total) by mouth at bedtime. Fenton Foy, NP Taking Active   atorvastatin (LIPITOR) 20 MG tablet [id number] Yes Take 1 tablet (20 mg total) by mouth daily. Fenton Foy, NP Taking Active   Blood Pressure KIT [id number]  1 Units by Does not apply route daily. Fenton Foy, NP  Active   cetirizine (ZYRTEC) 10 MG tablet [id number]  Take 1 tablet (10 mg total) by mouth daily.  Patient not taking: Reported on 06/01/2022   Talbot Grumbling, FNP  Active   fluticasone [facility]) 50 MCG/ACT nasal spray [id number]  Place 1 spray into both nostrils daily. Talbot Grumbling, FNP  Active   hydrochlorothiazide (HYDRODIURIL) 25 MG tablet [id number] Yes Take 1 tablet (25 mg total) by mouth daily. Fenton Foy, NP Taking Active   hydrocortisone (ANUSOL-HC) 25 MG suppository [id number]  Place 1 suppository (25 mg total) rectally 2 (two) times daily.  Patient not taking: Reported on 06/01/2022   Vevelyn Francois, NP  Active   hydrocortisone (PROCTOZONE-HC) 2.5 % rectal cream [id number]  Place 1 Application rectally 2 (two) times daily. Fenton Foy, NP  Active   ibuprofen (ADVIL) 600 MG tablet [id number]  Take 1 tablet (600  mg total) by mouth every 8 (eight) hours as needed. Talbot Grumbling, FNP  Active   losartan (COZAAR) 25 MG tablet [id number] Yes Take 1 tablet (25 mg total) by mouth at bedtime. Fenton Foy, NP Taking Active   meloxicam (MOBIC) 7.5 MG tablet [id number]  Take 1 tablet (7.5 mg total) by mouth daily. Fenton Foy, NP  Active            Med Note [facility], [place]   Thu Jun 25, 2022  8:15 AM) Refill  olopatadine (PATANOL) 0.1 % ophthalmic solution [id number] No Place 1 drop into both eyes 2 (two) times daily.  Patient not taking: Reported on 06/25/2022   Fenton Foy, NP Not Taking Active   pantoprazole (PROTONIX) 40 MG tablet [id number]  Take 1 tablet (40 mg total) by mouth daily.  Patient not taking: Reported on 06/01/2022   Fenton Foy, NP  Active   sildenafil (VIAGRA) 50 MG tablet [id number]  Take 1 tablet (50 mg total) by mouth daily as needed for erectile dysfunction.  Patient not taking: Reported on 02/16/2022   Azzie Glatter, FNP  Active   sildenafil (VIAGRA) 50 MG tablet [id number]  TAKE 1 TABLET (50 MG TOTAL) BY MOUTH DAILY AS NEEDED FOR ERECTILE DYSFUNCTION. Azzie Glatter, FNP  Expired 08/05/21 2359   tiZANidine (ZANAFLEX) 4 MG tablet [id number]  Take 1 tablet (4 mg total) by mouth every 6 (six) hours as needed for muscle spasms. Fenton Foy, NP  Active             SDOH Interventions Today    Flowsheet Row Most Recent Value  SDOH Interventions   Transportation Interventions Other (Comment)  [referral to clinic LCS]       Assessment/Plan:   Hypertension: - Currently uncontrolled - Reviewed long term cardiovascular and renal outcomes of uncontrolled blood pressure - Reviewed appropriate blood pressure monitoring technique and reviewed goal blood pressure. Recommended to check home blood pressure and heart rate daily. Recommend to bring home machine with him to upcoming cardiology appointment for validation - Recommend to change losartan to valsartan 80 mg daily. Discussed with PCP, she is in agreement. Continue amlodipine 10 mg daily and HCTZ 25 mg daily. Encouraged to discuss shortness of breath with cardiologist in 2 weeks. - Follow up with cardiology in 2 weeks   Hyperlipidemia/ASCVD Risk Reduction: - Currently uncontrolled but due for recheck - Recommend to continue current regimen and recommend recheck with next  labs  Follow Up Plan: phone call in 6 weeks  David Owen, PharmD, [place], [facility] [phone]

## 2022-07-15 ENCOUNTER — Encounter: Payer: Self-pay | Admitting: Interventional Cardiology

## 2022-07-15 ENCOUNTER — Other Ambulatory Visit: Payer: Self-pay

## 2022-07-15 ENCOUNTER — Ambulatory Visit: Payer: BC Managed Care – PPO | Attending: Interventional Cardiology | Admitting: Interventional Cardiology

## 2022-07-15 VITALS — BP 134/86 | HR 96 | Ht 63.0 in | Wt 193.6 lb

## 2022-07-15 DIAGNOSIS — Z01818 Encounter for other preprocedural examination: Secondary | ICD-10-CM | POA: Diagnosis not present

## 2022-07-15 DIAGNOSIS — R9431 Abnormal electrocardiogram [ECG] [EKG]: Secondary | ICD-10-CM

## 2022-07-15 DIAGNOSIS — I10 Essential (primary) hypertension: Secondary | ICD-10-CM

## 2022-07-15 MED ORDER — VALSARTAN 160 MG PO TABS
160.0000 mg | ORAL_TABLET | Freq: Every day | ORAL | 3 refills | Status: DC
  Filled 2022-07-15 – 2022-08-21 (×2): qty 90, 90d supply, fill #0
  Filled 2022-11-17: qty 90, 90d supply, fill #1
  Filled 2023-02-22: qty 90, 90d supply, fill #2
  Filled 2023-05-05: qty 30, 30d supply, fill #3
  Filled 2023-05-05: qty 90, 90d supply, fill #3
  Filled 2023-06-28: qty 30, 30d supply, fill #4

## 2022-07-15 NOTE — Patient Instructions (Signed)
Medication Instructions:  Your physician has recommended you make the following change in your medication: Increase Diovan to 160 mg by mouth daily   *If you need a refill on your cardiac medications before your next appointment, please call your pharmacy*   Lab Work: Your physician recommends that you return for lab work on day of echo--BMP.  This is not fasting.   If you have labs (blood work) drawn today and your tests are completely normal, you will receive your results only by: [place] (if you have MyChart) OR A paper copy in the mail If you have any lab test that is abnormal or we need to change your treatment, we will call you to review the results.   Testing/Procedures: Your physician has requested that you have an echocardiogram. Echocardiography is a painless test that uses sound waves to create images of your heart. It provides your doctor with information about the size and shape of your heart and how well your heart's chambers and valves are working. This procedure takes approximately one hour. There are no restrictions for this procedure. Please do NOT wear cologne, perfume, aftershave, or lotions (deodorant is allowed). Please arrive 15 minutes prior to your appointment time.     Follow-Up: At [facility], you and your health needs are our priority.  As part of our continuing mission to provide you with exceptional heart care, we have created designated Provider Care Teams.  These Care Teams include your primary Cardiologist (physician) and Advanced Practice Providers (APPs -  Physician Assistants and Nurse Practitioners) who all work together to provide you with the care you need, when you need it.  We recommend signing up for the patient portal called "MyChart".  Sign up information is provided on this After Visit Summary.  MyChart is used to connect with patients for Virtual Visits (Telemedicine).  Patients are able to view lab/test results, encounter  notes, upcoming appointments, etc.  Non-urgent messages can be sent to your provider as well.   To learn more about what you can do with MyChart, go to [email or web address].    Your next appointment:   As needed  Provider:   Larae Grooms, MD     Other Instructions

## 2022-07-15 NOTE — Progress Notes (Signed)
Cardiology Office Note   Date:  07/15/2022   ID:  Ashot, Car[place]an [birth year], MRN [id number]  PCP:  Fenton Foy, NP    No chief complaint on file.  Preoperative cardiovascular exam  Wt Readings from Last 3 Encounters:  07/15/22 193 lb 9.6 oz (87.8 kg)  06/25/22 192 lb 9.6 oz (87.4 kg)  06/01/22 193 lb 3.2 oz (87.6 kg)       History of Present Illness: David Owen is a 62 y.o. male who is being seen today for the evaluation of preoperative evaluation at the request of Fenton Foy, NP.  History obtained with interpreter.  He is originally from [place] he has been in the [place] since [place].  He has a history of hypertension.  Looking through the chart, he had occasional chest discomfort noted in 2022.  He was referred to cardiology at that time.  I do not see any evidence of cardiac testing done.  He is a Glass blower/designer at work.  He has to walk and load things into the machine. He denied cardiac symptoms with that activity. Last done in 12/23.  Nonsmoker.  Unsure of family history.  Daughter reports that his BP is usually high.  She states he is taking his meds regularly.    Denies : Chest pain. Dizziness. Leg edema. Nitroglycerin use. Orthopnea. Palpitations. Paroxysmal nocturnal dyspnea. Shortness of breath. Syncope.    Past Medical History:  Diagnosis Date   Acid reflux    Chronic ischemic heart disease 01/2020   Chronic kidney disease    stage III   Constipation    on miralax daily    Hemorrhoids    Hyperlipidemia    Hypertension    Sinus tachycardia by electrocardiogram 01/2020    Past Surgical History:  Procedure Laterality Date   COLONOSCOPY  01/2020   He will repeat colonoscopy in 7 years   MULTIPLE TOOTH EXTRACTIONS     NO PAST SURGERIES       Current Outpatient Medications  Medication Sig Dispense Refill   acetaminophen (TYLENOL) 500 MG tablet Take 2 tablets (1,000 mg total) by mouth every 6 (six) hours as needed. 30  tablet 0   amLODipine (NORVASC) 10 MG tablet Take 1 tablet (10 mg total) by mouth at bedtime. 90 tablet 3   atorvastatin (LIPITOR) 20 MG tablet Take 1 tablet (20 mg total) by mouth daily. 90 tablet 0   Blood Pressure KIT 1 Units by Does not apply route daily. 1 kit 0   fluticasone (FLONASE) 50 MCG/ACT nasal spray Place 1 spray into both nostrils daily. 16 g 2   hydrochlorothiazide (HYDRODIURIL) 25 MG tablet Take 1 tablet (25 mg total) by mouth daily. 30 tablet 2   hydrocortisone (PROCTOZONE-HC) 2.5 % rectal cream Place 1 Application rectally 2 (two) times daily. 30 g 0   ibuprofen (ADVIL) 600 MG tablet Take 1 tablet (600 mg total) by mouth every 8 (eight) hours as needed. 30 tablet 0   olopatadine (PATANOL) 0.1 % ophthalmic solution Place 1 drop into both eyes 2 (two) times daily. 5 mL 12   valsartan (DIOVAN) 80 MG tablet Take 1 tablet (80 mg total) by mouth daily. 90 tablet 1   cetirizine (ZYRTEC) 10 MG tablet Take 1 tablet (10 mg total) by mouth daily. (Patient not taking: Reported on 07/15/2022) 30 tablet 2   hydrocortisone (ANUSOL-HC) 25 MG suppository Place 1 suppository (25 mg total) rectally 2 (two) times daily. (Patient not  taking: Reported on 07/15/2022) 12 suppository 0   meloxicam (MOBIC) 7.5 MG tablet Take 1 tablet (7.5 mg total) by mouth daily. (Patient not taking: Reported on 07/15/2022) 30 tablet 0   pantoprazole (PROTONIX) 40 MG tablet Take 1 tablet (40 mg total) by mouth daily. (Patient not taking: Reported on 07/15/2022) 30 tablet 1   sildenafil (VIAGRA) 50 MG tablet Take 1 tablet (50 mg total) by mouth daily as needed for erectile dysfunction. (Patient not taking: Reported on 07/15/2022) 10 tablet 1   sildenafil (VIAGRA) 50 MG tablet TAKE 1 TABLET (50 MG TOTAL) BY MOUTH DAILY AS NEEDED FOR ERECTILE DYSFUNCTION. 10 tablet 1   tiZANidine (ZANAFLEX) 4 MG tablet Take 1 tablet (4 mg total) by mouth every 6 (six) hours as needed for muscle spasms. (Patient not taking: Reported on 07/15/2022)  30 tablet 0   No current facility-administered medications for this visit.    Allergies:   Patient has no known allergies.    Social History:  The patient  reports that he has never smoked. He has never used smokeless tobacco. He reports that he does not drink alcohol and does not use drugs.   Family History:  The patient's family history includes Healthy in his father and mother.    ROS:  Please see the history of present illness.   Otherwise, review of systems are positive for ankle pain.   All other systems are reviewed and negative.    PHYSICAL EXAM: VS:  BP 134/86   Pulse 96   Ht '5\' 3"'$  (1.6 m)   Wt 193 lb 9.6 oz (87.8 kg)   SpO2 96%   BMI 34.29 kg/m  , BMI Body mass index is 34.29 kg/m. GEN: Well nourished, well developed, in no acute distress HEENT: normal Neck: no JVD, carotid bruits, or masses Cardiac: RRR; no murmurs, rubs, or gallops,no edema  Respiratory:  clear to auscultation bilaterally, normal work of breathing GI: soft, nontender, nondistended, + BS MS: left ankle pain Skin: warm and dry, no rash Neuro:  Strength and sensation are intact Psych: euthymic mood, full affect   EKG:   The ekg ordered today demonstrates as noted below from 5/23; no change from that ECG   Recent Labs: 02/16/2022: ALT 30; BUN 17; Creatinine, Ser 1.18; Hemoglobin 15.4; Platelets 180; Potassium 3.5; Sodium 145   Lipid Panel    Component Value Date/Time   CHOL 174 03/28/2021 1424   TRIG 174 (H) 03/28/2021 1424   HDL 41 03/28/2021 1424   CHOLHDL 4.2 03/28/2021 1424   LDLCALC 103 (H) 03/28/2021 1424   LDLDIRECT 100 (H) 02/28/2020 1522     Other studies Reviewed: Additional studies/ records that were reviewed today with results demonstrating: November 2022 total cholesterol 174 HDL 41 triglycerides 174 LDL 103. May 2023 ECG showed normal sinus rhythm with LVH and associated ST changes in the lateral leads.  ASSESSMENT AND PLAN:  Preoperative cardiovascular exam:  open  reduction internal fixation of the left ankle was recommended by orthopedics in January.  No cardiac symptoms.   Hypertension: The current medical regimen is effective;  continue present plan and medications.  increase valsartan to 160 mg daily.  Check BMet in 2 weeks, or at the time of echocardiogram.   We checked his home blood pressure cuff in the office.  He got a reading of 162/101.  I then checked his blood pressure manually and got 162/84.  Will continue with plan to increase valsartan.  Further adjustments in medication could be  done by primary care doctor.  Abnormal ECG: Suggestive of LVH.  Check echocardiogram.  I suspect his LVEF and valvular function will be normal.  If so, no further cardiac testing would be needed prior to surgery.   Current medicines are reviewed at length with the patient today.  The patient concerns regarding his medicines were addressed.  The following changes have been made:  No change  Labs/ tests ordered today include: echo No orders of the defined types were placed in this encounter.   Recommend 150 minutes/week of aerobic exercise Low fat, low carb, high fiber diet recommended  Disposition:   FU as needed   Signed, Larae Grooms, MD  07/15/2022 10:44 AM    [facility] [place], [place], [place]  [address] Phone: [phone]; Fax: [phone]

## 2022-07-20 ENCOUNTER — Ambulatory Visit (HOSPITAL_COMMUNITY)
Admission: RE | Admit: 2022-07-20 | Discharge: 2022-07-20 | Disposition: A | Discharge status: Home / Self Care | Payer: BC Managed Care – PPO | Source: Ambulatory Visit | Attending: Interventional Cardiology | Admitting: Interventional Cardiology

## 2022-07-20 ENCOUNTER — Inpatient Hospital Stay (HOSPITAL_COMMUNITY): Admission: RE | Admit: 2022-07-20 | Payer: BC Managed Care – PPO | Source: Ambulatory Visit

## 2022-07-20 ENCOUNTER — Ambulatory Visit: Payer: BC Managed Care – PPO

## 2022-07-20 DIAGNOSIS — R9431 Abnormal electrocardiogram [ECG] [EKG]: Secondary | ICD-10-CM | POA: Insufficient documentation

## 2022-07-20 DIAGNOSIS — E785 Hyperlipidemia, unspecified: Secondary | ICD-10-CM | POA: Insufficient documentation

## 2022-07-20 DIAGNOSIS — Z01818 Encounter for other preprocedural examination: Secondary | ICD-10-CM | POA: Diagnosis not present

## 2022-07-20 DIAGNOSIS — Z01810 Encounter for preprocedural cardiovascular examination: Secondary | ICD-10-CM | POA: Insufficient documentation

## 2022-07-20 DIAGNOSIS — I10 Essential (primary) hypertension: Secondary | ICD-10-CM

## 2022-07-20 LAB — ECHOCARDIOGRAM COMPLETE
Area-P 1/2: 3.7 cm2
Calc EF: 71.1 %
S' Lateral: 2.3 cm
Single Plane A2C EF: 70.2 %
Single Plane A4C EF: 71.1 %

## 2022-07-20 LAB — BASIC METABOLIC PANEL
BUN/Creatinine Ratio: 13 (ref 10–24)
BUN: 16 mg/dL (ref 8–27)
CO2: 25 mmol/L (ref 20–29)
Calcium: 10.6 mg/dL — ABNORMAL HIGH (ref 8.6–10.2)
Chloride: 99 mmol/L (ref 96–106)
Creatinine, Ser: 1.27 mg/dL (ref 0.76–1.27)
Glucose: 130 mg/dL — ABNORMAL HIGH (ref 70–99)
Potassium: 3.9 mmol/L (ref 3.5–5.2)
Sodium: 143 mmol/L (ref 134–144)
eGFR: 64 mL/min/{1.73_m2} (ref >59)

## 2022-07-20 NOTE — Progress Notes (Signed)
Echocardiogram 2D Echocardiogram has been performed.  David Owen 07/20/2022, 9:50 AM

## 2022-07-21 ENCOUNTER — Other Ambulatory Visit: Payer: Self-pay

## 2022-07-21 ENCOUNTER — Telehealth: Payer: Self-pay

## 2022-07-21 NOTE — Telephone Encounter (Signed)
-----   Message from Jettie Booze, MD sent at 07/21/2022  3:00 PM EST ----- Electrolytes stable.  Mild hypercalcemia which he has had in the past.  Cc PMD

## 2022-07-21 NOTE — Telephone Encounter (Signed)
Patient gave the phone to his daughter. Aware of results and she verbalized understanding.

## 2022-07-28 ENCOUNTER — Other Ambulatory Visit: Payer: Self-pay

## 2022-07-29 ENCOUNTER — Other Ambulatory Visit: Payer: Self-pay

## 2022-08-05 ENCOUNTER — Other Ambulatory Visit (HOSPITAL_COMMUNITY): Payer: BC Managed Care – PPO

## 2022-08-13 ENCOUNTER — Other Ambulatory Visit: Payer: Self-pay

## 2022-08-13 ENCOUNTER — Other Ambulatory Visit: Payer: Self-pay | Admitting: Nurse Practitioner

## 2022-08-13 MED ORDER — HYDROCORTISONE (PERIANAL) 2.5 % EX CREA
1.0000 | TOPICAL_CREAM | Freq: Two times a day (BID) | CUTANEOUS | 0 refills | Status: DC
  Filled 2022-08-13: qty 30, 10d supply, fill #0

## 2022-08-13 NOTE — Telephone Encounter (Signed)
Please advise KH 

## 2022-08-14 ENCOUNTER — Other Ambulatory Visit: Payer: Self-pay

## 2022-08-18 ENCOUNTER — Other Ambulatory Visit: Payer: BC Managed Care – PPO | Admitting: Pharmacist

## 2022-08-18 ENCOUNTER — Other Ambulatory Visit: Payer: Self-pay

## 2022-08-18 DIAGNOSIS — I10 Essential (primary) hypertension: Secondary | ICD-10-CM

## 2022-08-18 MED ORDER — HYDROCHLOROTHIAZIDE 25 MG PO TABS
25.0000 mg | ORAL_TABLET | Freq: Every day | ORAL | 1 refills | Status: DC
  Filled 2022-08-18 – 2022-08-27 (×2): qty 90, 90d supply, fill #0

## 2022-08-18 MED ORDER — ATORVASTATIN CALCIUM 20 MG PO TABS
20.0000 mg | ORAL_TABLET | Freq: Every day | ORAL | 1 refills | Status: DC
  Filled 2022-08-18: qty 90, 90d supply, fill #0

## 2022-08-18 NOTE — Progress Notes (Signed)
08/18/2022 Name: David Owen MRN: [id number] DOB: Jul 17, 1960  Chief Complaint  Patient presents with   Medication Management   Hypertension    Abhinav Slone is a 62 y.o. year old male who presented for a telephone visit.   They were referred to the pharmacist by their PCP for assistance in managing hypertension.   Contacted with interpreter [place], [id number]  Subjective:  Care Team: Primary Care Provider: Fenton Foy, NP ; Next Scheduled Visit: 11/2022  Medication Access/Adherence  Current Pharmacy:  [facility], [place] [address] Phone: [phone] Fax: [phone]   Patient reports affordability concerns with their medications: No  Patient reports access/transportation concerns to their pharmacy: No  Patient reports adherence concerns with their medications:  No     Hypertension:  Current medications: valsartan 160 mg daily- has not picked up increased dose but has been taking 80 mg twice daily; HCTZ 25 mg, amlodipine 10 mg daily    Patient has a validated, automated, upper arm home BP cuff Current blood pressure readings readings: 140s/90s  Patient denies hypotensive s/sx including dizziness, lightheadedness.  Patient denies hypertensive symptoms including headache, chest pain, shortness of breath - does report headaches in the morning and attributes to insomnia  Hyperlipidemia/ASCVD Risk Reduction  Current lipid lowering medications: atorvastatin 20 mg daily   Objective:  Lab Results  Component Value Date   CREATININE 1.27 07/20/2022   BUN 16 07/20/2022   NA 143 07/20/2022   K 3.9 07/20/2022   CL 99 07/20/2022   CO2 25 07/20/2022    Lab Results  Component Value Date   CHOL 174 03/28/2021   HDL 41 03/28/2021   LDLCALC 103 (H) 03/28/2021   LDLDIRECT 100 (H) 02/28/2020   TRIG 174 (H) 03/28/2021   CHOLHDL 4.2 03/28/2021    Medications Reviewed Today      Reviewed by Maryan Rued, CMA (Certified Medical Assistant) on 07/15/22 at 90  Med List Status: <None>   Medication Order Taking? Sig Documenting Provider Last Dose Status Informant  acetaminophen (TYLENOL) 500 MG tablet [id number] Yes Take 2 tablets (1,000 mg total) by mouth every 6 (six) hours as needed. Talbot Grumbling, FNP Taking Active   amLODipine (NORVASC) 10 MG tablet [id number] Yes Take 1 tablet (10 mg total) by mouth at bedtime. Fenton Foy, NP Taking Active   atorvastatin (LIPITOR) 20 MG tablet [id number] Yes Take 1 tablet (20 mg total) by mouth daily. Fenton Foy, NP Taking Active   Blood Pressure KIT [id number] Yes 1 Units by Does not apply route daily. Fenton Foy, NP Taking Active   cetirizine (ZYRTEC) 10 MG tablet [id number] No Take 1 tablet (10 mg total) by mouth daily.  Patient not taking: Reported on 07/15/2022   Talbot Grumbling, FNP Not Taking Active   fluticasone [facility]) 50 MCG/ACT nasal spray [id number] Yes Place 1 spray into both nostrils daily. Talbot Grumbling, FNP Taking Active   hydrochlorothiazide (HYDRODIURIL) 25 MG tablet [id number] Yes Take 1 tablet (25 mg total) by mouth daily. Fenton Foy, NP Taking Active   hydrocortisone (ANUSOL-HC) 25 MG suppository [id number] No Place 1 suppository (25 mg total) rectally 2 (two) times daily.  Patient not taking: Reported on 07/15/2022   Vevelyn Francois, NP Not Taking Active   hydrocortisone (PROCTOZONE-HC) 2.5 % rectal cream [id number] Yes Place 1 Application rectally 2 (two) times daily. Fenton Foy, NP Taking Active  ibuprofen (ADVIL) 600 MG tablet [id number] Yes Take 1 tablet (600 mg total) by mouth every 8 (eight) hours as needed. Talbot Grumbling, FNP Taking Active   meloxicam (MOBIC) 7.5 MG tablet [id number] No Take 1 tablet (7.5 mg total) by mouth daily.  Patient not taking: Reported on 07/15/2022   Fenton Foy, NP Not Taking Active            Med Note  [facility], [facility]   Thu Jun 25, 2022  8:15 AM) Refill  olopatadine (PATANOL) 0.1 % ophthalmic solution [id number] Yes Place 1 drop into both eyes 2 (two) times daily. Fenton Foy, NP Taking Active   pantoprazole (PROTONIX) 40 MG tablet [id number] No Take 1 tablet (40 mg total) by mouth daily.  Patient not taking: Reported on 07/15/2022   Fenton Foy, NP Not Taking Active   sildenafil (VIAGRA) 50 MG tablet [id number] No Take 1 tablet (50 mg total) by mouth daily as needed for erectile dysfunction.  Patient not taking: Reported on 07/15/2022   Azzie Glatter, FNP Not Taking Active   sildenafil (VIAGRA) 50 MG tablet [id number]  TAKE 1 TABLET (50 MG TOTAL) BY MOUTH DAILY AS NEEDED FOR ERECTILE DYSFUNCTION. Azzie Glatter, FNP  Expired 08/05/21 2359   tiZANidine (ZANAFLEX) 4 MG tablet [id number] No Take 1 tablet (4 mg total) by mouth every 6 (six) hours as needed for muscle spasms.  Patient not taking: Reported on 07/15/2022   Fenton Foy, NP Not Taking Active   valsartan (DIOVAN) 80 MG tablet [id number] Yes Take 1 tablet (80 mg total) by mouth daily. Fenton Foy, NP Taking Active               Assessment/Plan:   Hypertension: - Currently uncontrolled - Reviewed long term cardiovascular and renal outcomes of uncontrolled blood pressure - Reviewed appropriate blood pressure monitoring technique and reviewed goal blood pressure. Recommended to check home blood pressure and heart rate daily - Recommend to increase valsartan to 320 mg daily. Continue HCTZ 25 mg daily, amlodipine 10 mg daily - Consider work up for sleep apnea given morning headaches, trouble sleeping, and snoring. Scheduled for office with Tonya    Follow Up Plan: PCP visit in ~2-4 weeks; pharmacist in ~ 6 weeks.   Catie Hedwig Morton, PharmD, [place], [facility] [phone]

## 2022-08-19 ENCOUNTER — Other Ambulatory Visit: Payer: Self-pay

## 2022-08-21 ENCOUNTER — Other Ambulatory Visit: Payer: Self-pay | Admitting: Nurse Practitioner

## 2022-08-21 ENCOUNTER — Other Ambulatory Visit: Payer: Self-pay

## 2022-08-27 ENCOUNTER — Other Ambulatory Visit: Payer: Self-pay

## 2022-08-27 ENCOUNTER — Other Ambulatory Visit: Payer: Self-pay | Admitting: Nurse Practitioner

## 2022-08-27 DIAGNOSIS — K219 Gastro-esophageal reflux disease without esophagitis: Secondary | ICD-10-CM

## 2022-08-27 MED ORDER — BENZONATATE 100 MG PO CAPS
100.0000 mg | ORAL_CAPSULE | Freq: Three times a day (TID) | ORAL | 0 refills | Status: DC
  Filled 2022-08-27: qty 21, 7d supply, fill #0

## 2022-08-27 MED ORDER — PANTOPRAZOLE SODIUM 40 MG PO TBEC
40.0000 mg | DELAYED_RELEASE_TABLET | Freq: Every day | ORAL | 1 refills | Status: AC
  Filled 2022-08-27: qty 30, 30d supply, fill #0
  Filled 2022-10-13: qty 30, 30d supply, fill #1

## 2022-08-27 NOTE — Telephone Encounter (Signed)
Please advise KH 

## 2022-09-01 ENCOUNTER — Other Ambulatory Visit: Payer: Self-pay

## 2022-09-01 ENCOUNTER — Other Ambulatory Visit: Payer: Self-pay | Admitting: Nurse Practitioner

## 2022-09-01 MED ORDER — HYDROCORTISONE (PERIANAL) 2.5 % EX CREA
1.0000 | TOPICAL_CREAM | Freq: Two times a day (BID) | CUTANEOUS | 0 refills | Status: DC
  Filled 2022-09-01: qty 30, 10d supply, fill #0

## 2022-09-17 ENCOUNTER — Ambulatory Visit: Payer: BC Managed Care – PPO | Admitting: Nurse Practitioner

## 2022-09-17 ENCOUNTER — Other Ambulatory Visit: Payer: Self-pay

## 2022-09-17 ENCOUNTER — Encounter: Payer: Self-pay | Admitting: Nurse Practitioner

## 2022-09-17 VITALS — BP 127/74 | HR 96 | Temp 98.1°F | Ht 63.0 in | Wt 199.2 lb

## 2022-09-17 DIAGNOSIS — I10 Essential (primary) hypertension: Secondary | ICD-10-CM | POA: Diagnosis not present

## 2022-09-17 DIAGNOSIS — Z13220 Encounter for screening for lipoid disorders: Secondary | ICD-10-CM | POA: Diagnosis not present

## 2022-09-17 DIAGNOSIS — M545 Low back pain, unspecified: Secondary | ICD-10-CM | POA: Diagnosis not present

## 2022-09-17 MED ORDER — HYDROCHLOROTHIAZIDE 25 MG PO TABS
25.0000 mg | ORAL_TABLET | Freq: Every day | ORAL | 1 refills | Status: DC
  Filled 2022-09-17 – 2022-11-03 (×3): qty 90, 90d supply, fill #0
  Filled 2023-02-22: qty 90, 90d supply, fill #1
  Filled ????-??-??: fill #0

## 2022-09-17 MED ORDER — ATORVASTATIN CALCIUM 20 MG PO TABS
20.0000 mg | ORAL_TABLET | Freq: Every day | ORAL | 1 refills | Status: DC
  Filled 2022-09-17 – 2023-02-04 (×2): qty 90, 90d supply, fill #0

## 2022-09-17 MED ORDER — PREDNISONE 20 MG PO TABS
20.0000 mg | ORAL_TABLET | Freq: Every day | ORAL | 0 refills | Status: AC
  Filled 2022-09-17: qty 5, 5d supply, fill #0

## 2022-09-17 MED ORDER — TIZANIDINE HCL 4 MG PO TABS
4.0000 mg | ORAL_TABLET | Freq: Four times a day (QID) | ORAL | 0 refills | Status: DC | PRN
  Filled 2022-09-17: qty 30, 8d supply, fill #0

## 2022-09-17 MED ORDER — AMLODIPINE BESYLATE 10 MG PO TABS
10.0000 mg | ORAL_TABLET | Freq: Every day | ORAL | 3 refills | Status: DC
  Filled 2022-09-17: qty 90, 90d supply, fill #0
  Filled 2022-12-03: qty 90, 90d supply, fill #1
  Filled 2023-03-12: qty 90, 90d supply, fill #2

## 2022-09-17 NOTE — Progress Notes (Signed)
@Patient  ID: David Owen, male    DOB: 02-17-1961, 62 y.o.   MRN: [id number]  Chief Complaint  Patient presents with   Follow-up    Referring provider: Ivonne Andrew, NP   HPI  David Owen presents for follow up. He  has a past medical history of Acid reflux, Chronic ischemic heart disease (01/2020), Chronic kidney disease, Constipation, Hemorrhoids, Hyperlipidemia, Hypertension, and Sinus tachycardia by electrocardiogram (01/2020).     Patient presents today for a follow-up visit.  His blood pressure is normal today.  Patient is compliant with medications.  Pharmacy is following with patient for medication management.  Denies f/c/s, n/v/d, hemoptysis, PND, leg swelling Denies chest pain or edema  Patient does complain of low back pain today that has been going on for 2 months.  He states that this started when he was wearing his leg brace after having ankle fracture.  We discussed that we will order an x-ray but patient would like to check with his insurance to make sure they will cover the x-ray before he gets it done.        No Known Allergies  Immunization History  Administered Date(s) Administered   Hepatitis A, Adult 04/04/2020   Hepatitis B 01/24/2019   Influenza,inj,Quad PF,6+ Mos 04/04/2020, 04/29/2020, 03/28/2021   MMR 01/24/2019   PFIZER(Purple Top)SARS-COV-2 Vaccination 01/25/2019, 02/29/2020   Td 01/24/2019    Past Medical History:  Diagnosis Date   Acid reflux    Chronic ischemic heart disease 01/2020   Chronic kidney disease    stage III   Constipation    on miralax daily    Hemorrhoids    Hyperlipidemia    Hypertension    Sinus tachycardia by electrocardiogram 01/2020    Tobacco History: Social History   Tobacco Use  Smoking Status Never  Smokeless Tobacco Never   Counseling given: Not Answered   Outpatient Encounter Medications as of 09/17/2022  Medication Sig   acetaminophen (TYLENOL) 500 MG tablet Take 2 tablets (1,000 mg  total) by mouth every 6 (six) hours as needed.   Blood Pressure KIT 1 Units by Does not apply route daily.   hydrocortisone (PROCTO-MED HC) 2.5 % rectal cream Place 1 Application rectally 2 (two) times daily.   ibuprofen (ADVIL) 600 MG tablet Take 1 tablet (600 mg total) by mouth every 8 (eight) hours as needed.   olopatadine (PATANOL) 0.1 % ophthalmic solution Place 1 drop into both eyes 2 (two) times daily.   predniSONE (DELTASONE) 20 MG tablet Take 1 tablet (20 mg total) by mouth daily with breakfast for 5 days.   valsartan (DIOVAN) 160 MG tablet Take 1 tablet (160 mg total) by mouth daily.   [DISCONTINUED] amLODipine (NORVASC) 10 MG tablet Take 1 tablet (10 mg total) by mouth at bedtime.   [DISCONTINUED] atorvastatin (LIPITOR) 20 MG tablet Take 1 tablet (20 mg total) by mouth daily.   [DISCONTINUED] hydrochlorothiazide (HYDRODIURIL) 25 MG tablet Take 1 tablet (25 mg total) by mouth daily.   amLODipine (NORVASC) 10 MG tablet Take 1 tablet (10 mg total) by mouth at bedtime.   atorvastatin (LIPITOR) 20 MG tablet Take 1 tablet (20 mg total) by mouth daily.   benzonatate (TESSALON) 100 MG capsule Take 1 capsule (100 mg total) by mouth every 8 (eight) hours.   cetirizine (ZYRTEC) 10 MG tablet Take 1 tablet (10 mg total) by mouth daily. (Patient not taking: Reported on 07/15/2022)   fluticasone (FLONASE) 50 MCG/ACT nasal spray Place 1 spray into  both nostrils daily.   hydrochlorothiazide (HYDRODIURIL) 25 MG tablet Take 1 tablet (25 mg total) by mouth daily.   hydrocortisone (ANUSOL-HC) 25 MG suppository Place 1 suppository (25 mg total) rectally 2 (two) times daily. (Patient not taking: Reported on 07/15/2022)   meloxicam (MOBIC) 7.5 MG tablet Take 1 tablet (7.5 mg total) by mouth daily. (Patient not taking: Reported on 07/15/2022)   pantoprazole (PROTONIX) 40 MG tablet Take 1 tablet (40 mg total) by mouth daily. (Patient not taking: Reported on 09/17/2022)   sildenafil (VIAGRA) 50 MG tablet Take 1 tablet  (50 mg total) by mouth daily as needed for erectile dysfunction. (Patient not taking: Reported on 07/15/2022)   sildenafil (VIAGRA) 50 MG tablet TAKE 1 TABLET (50 MG TOTAL) BY MOUTH DAILY AS NEEDED FOR ERECTILE DYSFUNCTION.   tiZANidine (ZANAFLEX) 4 MG tablet Take 1 tablet (4 mg total) by mouth every 6 (six) hours as needed for muscle spasms.   [DISCONTINUED] tiZANidine (ZANAFLEX) 4 MG tablet Take 1 tablet (4 mg total) by mouth every 6 (six) hours as needed for muscle spasms. (Patient not taking: Reported on 07/15/2022)   No facility-administered encounter medications on file as of 09/17/2022.     Review of Systems  Review of Systems  Constitutional: Negative.   HENT: Negative.    Cardiovascular: Negative.   Gastrointestinal: Negative.   Musculoskeletal:  Positive for back pain.  Allergic/Immunologic: Negative.   Neurological: Negative.   Psychiatric/Behavioral: Negative.         Physical Exam  BP 127/74   Pulse 96   Temp 98.1 F (36.7 C)   Ht 5\' 3"  (1.6 m)   Wt 199 lb 3.2 oz (90.4 kg)   SpO2 100%   BMI 35.29 kg/m   Wt Readings from Last 5 Encounters:  09/17/22 199 lb 3.2 oz (90.4 kg)  07/15/22 193 lb 9.6 oz (87.8 kg)  07/23/22 193 lb 14.4 oz (88 kg)  06/25/22 192 lb 9.6 oz (87.4 kg)  06/01/22 193 lb 3.2 oz (87.6 kg)     Physical Exam Vitals and nursing note reviewed.  Constitutional:      General: He is not in acute distress.    Appearance: He is well-developed.  Cardiovascular:     Rate and Rhythm: Normal rate and regular rhythm.  Pulmonary:     Effort: Pulmonary effort is normal.     Breath sounds: Normal breath sounds.  Skin:    General: Skin is warm and dry.  Neurological:     Mental Status: He is alert and oriented to person, place, and time.      Lab Results:  CBC    Component Value Date/Time   WBC 7.6 02/16/2022 1105   WBC 8.3 02/15/2020 1321   RBC 5.41 02/16/2022 1105   RBC 5.04 02/15/2020 1321   HGB 15.4 02/16/2022 1105   HCT 47.6  02/16/2022 1105   PLT 180 02/16/2022 1105   MCV 88 02/16/2022 1105   MCH 28.5 02/16/2022 1105   MCH 28.8 02/15/2020 1321   MCHC 32.4 02/16/2022 1105   MCHC 33.4 02/15/2020 1321   RDW 14.0 02/16/2022 1105   LYMPHSABS 1.8 08/05/2020 1135   EOSABS 0.2 08/05/2020 1135   BASOSABS 0.1 08/05/2020 1135    BMET    Component Value Date/Time   NA 143 07/20/2022 1038   K 3.9 07/20/2022 1038   CL 99 07/20/2022 1038   CO2 25 07/20/2022 1038   GLUCOSE 130 (H) 07/20/2022 1038   GLUCOSE 152 (H) 02/15/2020 1321  BUN 16 07/20/2022 1038   CREATININE 1.27 07/20/2022 1038   CALCIUM 10.6 (H) 07/20/2022 1038   GFRNONAA 62 06/05/2020 1118   GFRAA 72 06/05/2020 1118      Assessment & Plan:   Acute bilateral low back pain without sciatica - DG Lumbar Spine Complete - predniSONE (DELTASONE) 20 MG tablet; Take 1 tablet (20 mg total) by mouth daily with breakfast for 5 days.  Dispense: 5 tablet; Refill: 0 - tiZANidine (ZANAFLEX) 4 MG tablet; Take 1 tablet (4 mg total) by mouth every 6 (six) hours as needed for muscle spasms.  Dispense: 30 tablet; Refill: 0  2. Essential hypertension  - hydrochlorothiazide (HYDRODIURIL) 25 MG tablet; Take 1 tablet (25 mg total) by mouth daily.  Dispense: 90 tablet; Refill: 1 - amLODipine (NORVASC) 10 MG tablet; Take 1 tablet (10 mg total) by mouth at bedtime.  Dispense: 90 tablet; Refill: 3 - CBC - Comprehensive metabolic panel  3. Lipid screening  - Lipid Panel  Follow up:  Follow up in 6 months     Ivonne Andrew, NP 09/17/2022

## 2022-09-17 NOTE — Patient Instructions (Signed)
1. Acute bilateral low back pain without sciatica  - DG Lumbar Spine Complete - predniSONE (DELTASONE) 20 MG tablet; Take 1 tablet (20 mg total) by mouth daily with breakfast for 5 days.  Dispense: 5 tablet; Refill: 0 - tiZANidine (ZANAFLEX) 4 MG tablet; Take 1 tablet (4 mg total) by mouth every 6 (six) hours as needed for muscle spasms.  Dispense: 30 tablet; Refill: 0  2. Essential hypertension  - hydrochlorothiazide (HYDRODIURIL) 25 MG tablet; Take 1 tablet (25 mg total) by mouth daily.  Dispense: 90 tablet; Refill: 1 - amLODipine (NORVASC) 10 MG tablet; Take 1 tablet (10 mg total) by mouth at bedtime.  Dispense: 90 tablet; Refill: 3 - CBC - Comprehensive metabolic panel  3. Lipid screening  - Lipid Panel  Follow up:  Follow up in 6 months

## 2022-09-17 NOTE — Assessment & Plan Note (Signed)
-   DG Lumbar Spine Complete - predniSONE (DELTASONE) 20 MG tablet; Take 1 tablet (20 mg total) by mouth daily with breakfast for 5 days.  Dispense: 5 tablet; Refill: 0 - tiZANidine (ZANAFLEX) 4 MG tablet; Take 1 tablet (4 mg total) by mouth every 6 (six) hours as needed for muscle spasms.  Dispense: 30 tablet; Refill: 0  2. Essential hypertension  - hydrochlorothiazide (HYDRODIURIL) 25 MG tablet; Take 1 tablet (25 mg total) by mouth daily.  Dispense: 90 tablet; Refill: 1 - amLODipine (NORVASC) 10 MG tablet; Take 1 tablet (10 mg total) by mouth at bedtime.  Dispense: 90 tablet; Refill: 3 - CBC - Comprehensive metabolic panel  3. Lipid screening  - Lipid Panel  Follow up:  Follow up in 6 months

## 2022-09-18 LAB — CBC
Hematocrit: 43.3 % (ref 37.5–51.0)
Hemoglobin: 13.9 g/dL (ref 13.0–17.7)
MCH: 27.9 pg (ref 26.6–33.0)
MCHC: 32.1 g/dL (ref 31.5–35.7)
MCV: 87 fL (ref 79–97)
Platelets: 223 10*3/uL (ref 150–450)
RBC: 4.99 x10E6/uL (ref 4.14–5.80)
RDW: 14.7 % (ref 11.6–15.4)
WBC: 9 10*3/uL (ref 3.4–10.8)

## 2022-09-18 LAB — LIPID PANEL
Chol/HDL Ratio: 3.8 ratio (ref 0.0–5.0)
Cholesterol, Total: 168 mg/dL (ref 100–199)
HDL: 44 mg/dL (ref >39)
LDL Chol Calc (NIH): 96 mg/dL (ref 0–99)
Triglycerides: 160 mg/dL — ABNORMAL HIGH (ref 0–149)
VLDL Cholesterol Cal: 28 mg/dL (ref 5–40)

## 2022-09-18 LAB — COMPREHENSIVE METABOLIC PANEL
ALT: 27 IU/L (ref 0–44)
AST: 16 IU/L (ref 0–40)
Albumin/Globulin Ratio: 1.9 (ref 1.2–2.2)
Albumin: 4.9 g/dL (ref 3.9–4.9)
Alkaline Phosphatase: 115 IU/L (ref 44–121)
BUN/Creatinine Ratio: 13 (ref 10–24)
BUN: 16 mg/dL (ref 8–27)
Bilirubin Total: 0.3 mg/dL (ref 0.0–1.2)
CO2: 23 mmol/L (ref 20–29)
Calcium: 10.2 mg/dL (ref 8.6–10.2)
Chloride: 103 mmol/L (ref 96–106)
Creatinine, Ser: 1.26 mg/dL (ref 0.76–1.27)
Globulin, Total: 2.6 g/dL (ref 1.5–4.5)
Glucose: 147 mg/dL — ABNORMAL HIGH (ref 70–99)
Potassium: 3.8 mmol/L (ref 3.5–5.2)
Sodium: 143 mmol/L (ref 134–144)
Total Protein: 7.5 g/dL (ref 6.0–8.5)
eGFR: 64 mL/min/{1.73_m2} (ref >59)

## 2022-09-29 ENCOUNTER — Other Ambulatory Visit: Payer: Self-pay | Admitting: Nurse Practitioner

## 2022-09-29 ENCOUNTER — Other Ambulatory Visit: Payer: Self-pay

## 2022-09-29 MED ORDER — HYDROCORTISONE (PERIANAL) 2.5 % EX CREA
1.0000 | TOPICAL_CREAM | Freq: Two times a day (BID) | CUTANEOUS | 0 refills | Status: DC
  Filled 2022-09-29: qty 30, 10d supply, fill #0

## 2022-09-29 MED ORDER — BENZONATATE 100 MG PO CAPS
100.0000 mg | ORAL_CAPSULE | Freq: Three times a day (TID) | ORAL | 0 refills | Status: DC
  Filled 2022-09-29: qty 21, 7d supply, fill #0

## 2022-09-29 MED ORDER — SILDENAFIL CITRATE 50 MG PO TABS
50.0000 mg | ORAL_TABLET | ORAL | 1 refills | Status: AC | PRN
  Filled 2022-09-29: qty 10, 30d supply, fill #0
  Filled 2022-10-13: qty 10, 30d supply, fill #1

## 2022-09-30 ENCOUNTER — Other Ambulatory Visit: Payer: Self-pay

## 2022-10-13 ENCOUNTER — Other Ambulatory Visit: Payer: Self-pay

## 2022-10-13 ENCOUNTER — Telehealth: Payer: Self-pay | Admitting: Pharmacist

## 2022-10-13 ENCOUNTER — Other Ambulatory Visit: Payer: BC Managed Care – PPO | Admitting: Pharmacist

## 2022-10-13 NOTE — Progress Notes (Signed)
10/13/2022 Name: Oseias Regenold MRN: [id number] DOB: 1960-06-24  Chief Complaint  Patient presents with   Medication Management   Hypertension    Delynn Marana is a 62 y.o. year old male who presented for a telephone visit.   They were referred to the pharmacist by their PCP for assistance in managing hypertension.   Subjective:  Care Team: Primary Care Provider: Ivonne Andrew, NP ; Next Scheduled Visit: 11/30/22  Medication Access/Adherence  Current Pharmacy:  [facility] [address], [address] [place] [address] Phone: [phone] Fax: [phone]   Patient reports affordability concerns with their medications: No  Patient reports access/transportation concerns to their pharmacy: No  Patient reports adherence concerns with their medications:  No     Hypertension:  Current medications: valsartan 160 mg daily - had discussed increasing to 320 mg at last visit with me, order was not placed; HCTZ 25 mg, amlodipine 10 mg daily    Patient has a validated, automated, upper arm home BP cuff Current blood pressure readings readings: SBP 140s before medications, but SBP 130s if taken later in the afternoon  Patient denies hypotensive s/sx including dizziness, lightheadedness.  Patient denies hypertensive symptoms including headache, chest pain, shortness of breath  Hyperlipidemia/ASCVD Risk Reduction  Current lipid lowering medications: atorvastatin 20 mg daily   Objective:    Lab Results  Component Value Date   CREATININE 1.26 09/17/2022   BUN 16 09/17/2022   NA 143 09/17/2022   K 3.8 09/17/2022   CL 103 09/17/2022   CO2 23 09/17/2022    Lab Results  Component Value Date   CHOL 168 09/17/2022   HDL 44 09/17/2022   LDLCALC 96 09/17/2022   LDLDIRECT 100 (H) 02/28/2020   TRIG 160 (H) 09/17/2022   CHOLHDL 3.8 09/17/2022    Medications Reviewed Today     Reviewed by Alden Hipp,  RPH-CPP (Pharmacist) on 10/13/22 at 0920  Med List Status: <None>   Medication Order Taking? Sig Documenting Provider Last Dose Status Informant  acetaminophen (TYLENOL) 500 MG tablet [id number] Yes Take 2 tablets (1,000 mg total) by mouth every 6 (six) hours as needed. Carlisle Beers, FNP Taking Active   amLODipine (NORVASC) 10 MG tablet [id number] Yes Take 1 tablet (10 mg total) by mouth at bedtime. Ivonne Andrew, NP Taking Active   atorvastatin (LIPITOR) 20 MG tablet [id number] Yes Take 1 tablet (20 mg total) by mouth daily. Ivonne Andrew, NP Taking Active   benzonatate (TESSALON) 100 MG capsule [id number] No Take 1 capsule (100 mg total) by mouth every 8 (eight) hours.  Patient not taking: Reported on 10/13/2022   Ivonne Andrew, NP Not Taking Active   Blood Pressure KIT [id number] No 1 Units by Does not apply route daily.  Patient not taking: Reported on 10/13/2022   Ivonne Andrew, NP Not Taking Active   cetirizine (ZYRTEC) 10 MG tablet [id number] No Take 1 tablet (10 mg total) by mouth daily.  Patient not taking: Reported on 07/15/2022   Carlisle Beers, FNP Not Taking Active   fluticasone [facility]) 50 MCG/ACT nasal spray [id number]  Place 1 spray into both nostrils daily. Carlisle Beers, FNP  Active   hydrochlorothiazide (HYDRODIURIL) 25 MG tablet [id number] Yes Take 1 tablet (25 mg total) by mouth daily. Ivonne Andrew, NP Taking Active   hydrocortisone (ANUSOL-HC) 25 MG suppository [id number]  Place 1 suppository (25 mg total) rectally 2 (two) times  daily.  Patient not taking: Reported on 07/15/2022   Barbette Merino, NP  Active   hydrocortisone (PROCTO-MED [facility]) 2.5 % rectal cream [id number] No Place 1 Application rectally 2 (two) times daily.  Patient not taking: Reported on 10/13/2022   Ivonne Andrew, NP Not Taking Active   ibuprofen (ADVIL) 600 MG tablet [id number] No Take 1 tablet (600 mg total) by mouth every 8 (eight) hours as needed.  Patient not taking:  Reported on 10/13/2022   Carlisle Beers, FNP Not Taking Active   meloxicam (MOBIC) 7.5 MG tablet [id number] No Take 1 tablet (7.5 mg total) by mouth daily.  Patient not taking: Reported on 07/15/2022   Ivonne Andrew, NP Not Taking Active            Med Note Clearance Coots, Janace Litten   Tue Oct 13, 2022  9:13 AM)    olopatadine (PATANOL) 0.1 % ophthalmic solution [id number] No Place 1 drop into both eyes 2 (two) times daily.  Patient not taking: Reported on 10/13/2022   Ivonne Andrew, NP Not Taking Active   pantoprazole (PROTONIX) 40 MG tablet [id number] No Take 1 tablet (40 mg total) by mouth daily.  Patient not taking: Reported on 09/17/2022   Ivonne Andrew, NP Not Taking Active   sildenafil (VIAGRA) 50 MG tablet [id number] No Take 1 tablet (50 mg total) by mouth as needed for erectile dysfunction  Patient not taking: Reported on 10/13/2022   Ivonne Andrew, NP Not Taking Active   tiZANidine (ZANAFLEX) 4 MG tablet [id number] No Take 1 tablet (4 mg total) by mouth every 6 (six) hours as needed for muscle spasms.  Patient not taking: Reported on 10/13/2022   Ivonne Andrew, NP Not Taking Active   valsartan (DIOVAN) 160 MG tablet [id number] Yes Take 1 tablet (160 mg total) by mouth daily. Corky Crafts, MD Taking Active               Assessment/Plan:   Hypertension: - Currently controlled - Reviewed long term cardiovascular and renal outcomes of uncontrolled blood pressure - Reviewed appropriate blood pressure monitoring technique and reviewed goal blood pressure. Recommended to check home blood pressure and heart rate periodically, document, and provide at future visit - Recommend to continue current regimen at this time  Hyperlipidemia/ASCVD Risk Reduction: - Currently controlled at goal <100 - Recommend to continue current regimen at this time  Requests refill on hydrocortisone cream, tizanidine, pantoprazole, sildenafil. Will collaborate with pharmacy and provider  to refill.   Follow Up Plan: goals of pharmacy care met, closing case at this time. Follow up with PCP as scheduled  Catie Eppie Gibson, PharmD, BCACP, CPP [facility] [phone]

## 2022-10-13 NOTE — Progress Notes (Signed)
Patient requests refill on tizanidine and hydrocortisone cream.

## 2022-10-14 ENCOUNTER — Other Ambulatory Visit: Payer: Self-pay | Admitting: Nurse Practitioner

## 2022-10-14 ENCOUNTER — Other Ambulatory Visit: Payer: Self-pay

## 2022-10-14 DIAGNOSIS — M545 Low back pain, unspecified: Secondary | ICD-10-CM

## 2022-10-14 DIAGNOSIS — K649 Unspecified hemorrhoids: Secondary | ICD-10-CM

## 2022-10-14 MED ORDER — TIZANIDINE HCL 4 MG PO TABS
4.0000 mg | ORAL_TABLET | Freq: Four times a day (QID) | ORAL | 0 refills | Status: AC | PRN
  Filled 2022-10-14: qty 30, 8d supply, fill #0

## 2022-10-14 MED ORDER — HYDROCORTISONE (PERIANAL) 2.5 % EX CREA
1.0000 | TOPICAL_CREAM | Freq: Two times a day (BID) | CUTANEOUS | 0 refills | Status: DC
  Filled 2022-10-14: qty 30, 10d supply, fill #0

## 2022-10-14 MED ORDER — HYDROCORTISONE ACETATE 25 MG RE SUPP
25.0000 mg | Freq: Two times a day (BID) | RECTAL | 0 refills | Status: DC
  Filled 2022-10-14: qty 12, 6d supply, fill #0

## 2022-10-14 NOTE — Telephone Encounter (Signed)
Please advise KH 

## 2022-10-15 ENCOUNTER — Other Ambulatory Visit: Payer: Self-pay

## 2022-10-19 ENCOUNTER — Other Ambulatory Visit: Payer: Self-pay

## 2022-10-27 ENCOUNTER — Other Ambulatory Visit: Payer: Self-pay

## 2022-10-27 ENCOUNTER — Other Ambulatory Visit: Payer: Self-pay | Admitting: Nurse Practitioner

## 2022-10-28 ENCOUNTER — Other Ambulatory Visit: Payer: Self-pay

## 2022-10-28 MED ORDER — BENZONATATE 100 MG PO CAPS
100.0000 mg | ORAL_CAPSULE | Freq: Three times a day (TID) | ORAL | 0 refills | Status: AC
  Filled 2022-10-28: qty 21, 7d supply, fill #0

## 2022-10-28 MED ORDER — HYDROCORTISONE (PERIANAL) 2.5 % EX CREA
1.0000 | TOPICAL_CREAM | Freq: Two times a day (BID) | CUTANEOUS | 0 refills | Status: DC
  Filled 2022-10-28: qty 30, 10d supply, fill #0

## 2022-10-28 NOTE — Telephone Encounter (Signed)
Please advise KH 

## 2022-11-03 ENCOUNTER — Other Ambulatory Visit: Payer: Self-pay

## 2022-11-17 ENCOUNTER — Other Ambulatory Visit: Payer: Self-pay

## 2022-11-17 ENCOUNTER — Other Ambulatory Visit: Payer: Self-pay | Admitting: Nurse Practitioner

## 2022-11-18 ENCOUNTER — Other Ambulatory Visit: Payer: Self-pay

## 2022-11-18 MED ORDER — HYDROCORTISONE (PERIANAL) 2.5 % EX CREA
1.0000 | TOPICAL_CREAM | Freq: Two times a day (BID) | CUTANEOUS | 0 refills | Status: DC
  Filled 2022-11-18: qty 30, 10d supply, fill #0

## 2022-11-18 NOTE — Telephone Encounter (Signed)
Please advise KH 

## 2022-11-30 ENCOUNTER — Ambulatory Visit: Payer: Self-pay | Admitting: Nurse Practitioner

## 2022-12-03 ENCOUNTER — Other Ambulatory Visit: Payer: Self-pay

## 2022-12-03 ENCOUNTER — Other Ambulatory Visit: Payer: Self-pay | Admitting: Nurse Practitioner

## 2022-12-03 MED ORDER — HYDROCORTISONE (PERIANAL) 2.5 % EX CREA
1.0000 | TOPICAL_CREAM | Freq: Two times a day (BID) | CUTANEOUS | 0 refills | Status: AC
  Filled 2022-12-03: qty 30, 10d supply, fill #0

## 2022-12-04 ENCOUNTER — Other Ambulatory Visit: Payer: Self-pay

## 2023-02-04 ENCOUNTER — Other Ambulatory Visit: Payer: Self-pay

## 2023-02-22 ENCOUNTER — Other Ambulatory Visit: Payer: Self-pay

## 2023-02-23 ENCOUNTER — Other Ambulatory Visit: Payer: Self-pay

## 2023-03-12 ENCOUNTER — Other Ambulatory Visit: Payer: Self-pay

## 2023-03-19 ENCOUNTER — Encounter: Payer: Self-pay | Admitting: Nurse Practitioner

## 2023-03-19 ENCOUNTER — Other Ambulatory Visit: Payer: Self-pay

## 2023-03-19 ENCOUNTER — Ambulatory Visit (INDEPENDENT_AMBULATORY_CARE_PROVIDER_SITE_OTHER): Payer: BC Managed Care – PPO | Admitting: Nurse Practitioner

## 2023-03-19 VITALS — BP 141/84 | HR 89 | Resp 16 | Ht 63.0 in | Wt 208.0 lb

## 2023-03-19 DIAGNOSIS — K649 Unspecified hemorrhoids: Secondary | ICD-10-CM

## 2023-03-19 DIAGNOSIS — I10 Essential (primary) hypertension: Secondary | ICD-10-CM | POA: Diagnosis not present

## 2023-03-19 DIAGNOSIS — J069 Acute upper respiratory infection, unspecified: Secondary | ICD-10-CM | POA: Diagnosis not present

## 2023-03-19 DIAGNOSIS — Z23 Encounter for immunization: Secondary | ICD-10-CM | POA: Diagnosis not present

## 2023-03-19 DIAGNOSIS — Z9109 Other allergy status, other than to drugs and biological substances: Secondary | ICD-10-CM | POA: Diagnosis not present

## 2023-03-19 MED ORDER — OLOPATADINE HCL 0.1 % OP SOLN
1.0000 [drp] | Freq: Two times a day (BID) | OPHTHALMIC | 12 refills | Status: AC
  Filled 2023-03-19: qty 5, 25d supply, fill #0

## 2023-03-19 MED ORDER — GUAIFENESIN ER 600 MG PO TB12
600.0000 mg | ORAL_TABLET | Freq: Two times a day (BID) | ORAL | 0 refills | Status: AC
  Filled 2023-03-19: qty 14, 7d supply, fill #0

## 2023-03-19 MED ORDER — AMLODIPINE BESYLATE 10 MG PO TABS
10.0000 mg | ORAL_TABLET | Freq: Every day | ORAL | 3 refills | Status: DC
  Filled 2023-03-19: qty 90, 90d supply, fill #0
  Filled 2023-06-16: qty 30, 30d supply, fill #0
  Filled 2023-07-13: qty 30, 30d supply, fill #1
  Filled 2023-08-10: qty 30, 30d supply, fill #2
  Filled 2023-09-10: qty 30, 30d supply, fill #3
  Filled 2023-10-14: qty 30, 30d supply, fill #4
  Filled 2023-11-16: qty 30, 30d supply, fill #5
  Filled 2023-12-20: qty 30, 30d supply, fill #6
  Filled 2024-01-25: qty 30, 30d supply, fill #7
  Filled 2024-02-28: qty 30, 30d supply, fill #8

## 2023-03-19 MED ORDER — HYDROCORTISONE ACETATE 25 MG RE SUPP
25.0000 mg | Freq: Two times a day (BID) | RECTAL | 0 refills | Status: DC
  Filled 2023-03-19: qty 12, 6d supply, fill #0

## 2023-03-19 MED ORDER — ATORVASTATIN CALCIUM 20 MG PO TABS
20.0000 mg | ORAL_TABLET | Freq: Every day | ORAL | 1 refills | Status: DC
  Filled 2023-03-19 – 2023-05-05 (×2): qty 90, 90d supply, fill #0
  Filled 2023-05-05: qty 30, 30d supply, fill #0
  Filled 2023-06-03: qty 30, 30d supply, fill #1
  Filled 2023-08-10: qty 30, 30d supply, fill #3
  Filled 2023-09-10: qty 30, 30d supply, fill #4
  Filled 2023-10-07: qty 30, 30d supply, fill #5

## 2023-03-19 MED ORDER — HYDROCHLOROTHIAZIDE 25 MG PO TABS
25.0000 mg | ORAL_TABLET | Freq: Every day | ORAL | 1 refills | Status: DC
  Filled 2023-03-19 – 2023-06-03 (×2): qty 90, 90d supply, fill #0
  Filled 2023-08-30: qty 90, 90d supply, fill #1

## 2023-03-19 MED ORDER — PREDNISONE 20 MG PO TABS
20.0000 mg | ORAL_TABLET | Freq: Every day | ORAL | 0 refills | Status: AC
  Filled 2023-03-19: qty 5, 5d supply, fill #0

## 2023-03-19 NOTE — Progress Notes (Addendum)
Subjective   Patient ID: David Owen, male    DOB: [birth year], 62 y.o.   MRN: [id number]  Chief Complaint  Patient presents with   Medical Management of Chronic Issues    Patient states that he has the "flu" for the past 2 months. He does not have a cough but he has yellow phlegm. He states that his vision changes and he has lots of tears. He also states that he has a hemorrhoid issue. Discuss the flu shot.     Referring provider: Ivonne Andrew, NP  David Owen is a 62 y.o. male with Past Medical History: No date: Acid reflux 01/2020: Chronic ischemic heart disease No date: Chronic kidney disease     Comment:  stage III No date: Constipation     Comment:  on miralax daily  No date: Hemorrhoids No date: Hyperlipidemia No date: Hypertension 01/2020: Sinus tachycardia by electrocardiogram   HPI  Patient presents today for a follow-up visit.  His blood pressure is normal today.  Patient is compliant with medications.  Pharmacy is following with patient for medication management.  Denies f/c/s, n/v/d, hemoptysis, PND, leg swelling Denies chest pain or edema   Patient complains of ongoing sinus drainage and congestion for 2 months.  We will trial prednisone and Mucinex.  Patient also complains of ongoing hemorrhoids.  We will trial a suppository and refer patient to GI for further evaluation and treatment.  Patient states that he does need an eye appointment due to decreased vision in itchy watery eyes.  We will refill Pataday eyedrops and handout was given for local optometrist in the area who accept patient's insurance.  No Known Allergies  Immunization History  Administered Date(s) Administered   Hepatitis A, Adult 04/04/2020   Hepatitis B 01/24/2019   Influenza,inj,Quad PF,6+ Mos 04/04/2020, 04/29/2020, 03/28/2021   MMR 01/24/2019   PFIZER(Purple Top)SARS-COV-2 Vaccination 01/25/2019, 02/29/2020   Td 01/24/2019    Tobacco History: Social History    Tobacco Use  Smoking Status Never  Smokeless Tobacco Never   Counseling given: Not Answered   Outpatient Encounter Medications as of 03/19/2023  Medication Sig   acetaminophen (TYLENOL) 500 MG tablet Take 2 tablets (1,000 mg total) by mouth every 6 (six) hours as needed.   fluticasone (FLONASE) 50 MCG/ACT nasal spray Place 1 spray into both nostrils daily.   guaiFENesin (MUCINEX) 600 MG 12 hr tablet Take 1 tablet (600 mg total) by mouth 2 (two) times daily for 7 days.   hydrocortisone (PROCTOZONE-HC) 2.5 % rectal cream Place 1 Application rectally 2 (two) times daily.   predniSONE (DELTASONE) 20 MG tablet Take 1 tablet (20 mg total) by mouth daily with breakfast for 5 days.   tiZANidine (ZANAFLEX) 4 MG tablet Take 1 tablet (4 mg total) by mouth every 6 (six) hours as needed for muscle spasms.   valsartan (DIOVAN) 160 MG tablet Take 1 tablet (160 mg total) by mouth daily.   [DISCONTINUED] amLODipine (NORVASC) 10 MG tablet Take 1 tablet (10 mg total) by mouth at bedtime.   [DISCONTINUED] atorvastatin (LIPITOR) 20 MG tablet Take 1 tablet (20 mg total) by mouth daily.   [DISCONTINUED] hydrochlorothiazide (HYDRODIURIL) 25 MG tablet Take 1 tablet (25 mg total) by mouth daily.   [DISCONTINUED] hydrocortisone (ANUSOL-HC) 25 MG suppository Place 1 suppository (25 mg total) rectally 2 (two) times daily.   amLODipine (NORVASC) 10 MG tablet Take 1 tablet (10 mg total) by mouth at bedtime.   atorvastatin (LIPITOR) 20 MG tablet Take  1 tablet (20 mg total) by mouth daily.   benzonatate (TESSALON) 100 MG capsule Take 1 capsule (100 mg total) by mouth every 8 (eight) hours. (Patient not taking: Reported on 03/19/2023)   Blood Pressure KIT 1 Units by Does not apply route daily. (Patient not taking: Reported on 10/13/2022)   cetirizine (ZYRTEC) 10 MG tablet Take 1 tablet (10 mg total) by mouth daily. (Patient not taking: Reported on 07/15/2022)   hydrochlorothiazide (HYDRODIURIL) 25 MG tablet Take 1 tablet (25  mg total) by mouth daily.   hydrocortisone (ANUSOL-HC) 25 MG suppository Place 1 suppository (25 mg total) rectally 2 (two) times daily.   ibuprofen (ADVIL) 600 MG tablet Take 1 tablet (600 mg total) by mouth every 8 (eight) hours as needed. (Patient not taking: Reported on 10/13/2022)   meloxicam (MOBIC) 7.5 MG tablet Take 1 tablet (7.5 mg total) by mouth daily. (Patient not taking: Reported on 07/15/2022)   olopatadine (PATANOL) 0.1 % ophthalmic solution Place 1 drop into both eyes 2 (two) times daily.   pantoprazole (PROTONIX) 40 MG tablet Take 1 tablet (40 mg total) by mouth daily. (Patient not taking: Reported on 09/17/2022)   sildenafil (VIAGRA) 50 MG tablet Take 1 tablet (50 mg total) by mouth as needed for erectile dysfunction (Patient not taking: Reported on 10/13/2022)   [DISCONTINUED] olopatadine (PATANOL) 0.1 % ophthalmic solution Place 1 drop into both eyes 2 (two) times daily. (Patient not taking: Reported on 10/13/2022)   No facility-administered encounter medications on file as of 03/19/2023.    Review of Systems  Review of Systems  Constitutional: Negative.   HENT:  Positive for congestion, sinus pressure and sinus pain.   Cardiovascular: Negative.   Gastrointestinal: Negative.   Allergic/Immunologic: Negative.   Neurological: Negative.   Psychiatric/Behavioral: Negative.       Objective:   BP (!) 166/86   Pulse 89   Resp 16   Ht 5\' 3"  (1.6 m)   Wt 208 lb (94.3 kg)   SpO2 98%   BMI 36.85 kg/m   Wt Readings from Last 5 Encounters:  03/19/23 208 lb (94.3 kg)  09/17/22 199 lb 3.2 oz (90.4 kg)  07/15/22 193 lb 9.6 oz (87.8 kg)  07/23/22 193 lb 14.4 oz (88 kg)  06/25/22 192 lb 9.6 oz (87.4 kg)     Physical Exam Vitals and nursing note reviewed.  Constitutional:      General: He is not in acute distress.    Appearance: He is well-developed.  HENT:     Nose: Congestion present.  Cardiovascular:     Rate and Rhythm: Normal rate and regular rhythm.  Pulmonary:      Effort: Pulmonary effort is normal.     Breath sounds: Normal breath sounds.  Skin:    General: Skin is warm and dry.  Neurological:     Mental Status: He is alert and oriented to person, place, and time.       Assessment & Plan:   Essential hypertension -     CBC -     Comprehensive metabolic panel -     hydroCHLOROthiazide; Take 1 tablet (25 mg total) by mouth daily.  Dispense: 90 tablet; Refill: 1 -     amLODIPine Besylate; Take 1 tablet (10 mg total) by mouth at bedtime.  Dispense: 90 tablet; Refill: 3  Environmental allergies -     Olopatadine HCl; Place 1 drop into both eyes 2 (two) times daily.  Dispense: 5 mL; Refill: 12  Hemorrhoids, unspecified hemorrhoid  type -     Hydrocortisone Acetate; Place 1 suppository (25 mg total) rectally 2 (two) times daily.  Dispense: 12 suppository; Refill: 0 -     Ambulatory referral to Gastroenterology  Upper respiratory tract infection, unspecified type -     predniSONE; Take 1 tablet (20 mg total) by mouth daily with breakfast for 5 days.  Dispense: 5 tablet; Refill: 0 -     guaiFENesin ER; Take 1 tablet (600 mg total) by mouth 2 (two) times daily for 7 days.  Dispense: 14 tablet; Refill: 0  Other orders -     Atorvastatin Calcium; Take 1 tablet (20 mg total) by mouth daily.  Dispense: 90 tablet; Refill: 1     Return in about 6 months (around 09/16/2023).   Ivonne Andrew, NP 03/19/2023

## 2023-03-19 NOTE — Addendum Note (Signed)
Addended by: Jama Flavors on: 03/19/2023 11:12 AM   Modules accepted: Orders

## 2023-03-19 NOTE — Patient Instructions (Addendum)
1. Essential hypertension  - CBC - Comprehensive metabolic panel - hydrochlorothiazide (HYDRODIURIL) 25 MG tablet; Take 1 tablet (25 mg total) by mouth daily.  Dispense: 90 tablet; Refill: 1 - amLODipine (NORVASC) 10 MG tablet; Take 1 tablet (10 mg total) by mouth at bedtime.  Dispense: 90 tablet; Refill: 3  2. Environmental allergies  - olopatadine (PATANOL) 0.1 % ophthalmic solution; Place 1 drop into both eyes 2 (two) times daily.  Dispense: 5 mL; Refill: 12  3. Hemorrhoids, unspecified hemorrhoid type  - hydrocortisone (ANUSOL-HC) 25 MG suppository; Place 1 suppository (25 mg total) rectally 2 (two) times daily.  Dispense: 12 suppository; Refill: 0 - Ambulatory referral to Gastroenterology  4. Upper respiratory tract infection, unspecified type  - predniSONE (DELTASONE) 20 MG tablet; Take 1 tablet (20 mg total) by mouth daily with breakfast for 5 days.  Dispense: 5 tablet; Refill: 0 - guaiFENesin (MUCINEX) 600 MG 12 hr tablet; Take 1 tablet (600 mg total) by mouth 2 (two) times daily for 7 days.  Dispense: 14 tablet; Refill: 0  Follow up:  Follow up in 6 months

## 2023-03-20 LAB — CBC
Hematocrit: 20.7 % — ABNORMAL LOW (ref 37.5–51.0)
Hemoglobin: 6.7 g/dL — CL (ref 13.0–17.7)
MCH: 30.6 pg (ref 26.6–33.0)
MCHC: 32.4 g/dL (ref 31.5–35.7)
MCV: 95 fL (ref 79–97)
NRBC: 1 % — ABNORMAL HIGH (ref 0–0)
Platelets: 435 10*3/uL (ref 150–450)
RBC: 2.19 x10E6/uL — CL (ref 4.14–5.80)
RDW: 14.4 % (ref 11.6–15.4)
WBC: 8.4 10*3/uL (ref 3.4–10.8)

## 2023-03-22 ENCOUNTER — Encounter (HOSPITAL_COMMUNITY): Payer: Self-pay | Admitting: Emergency Medicine

## 2023-03-22 ENCOUNTER — Other Ambulatory Visit: Payer: Self-pay

## 2023-03-22 ENCOUNTER — Emergency Department (HOSPITAL_COMMUNITY)
Admission: EM | Admit: 2023-03-22 | Discharge: 2023-03-22 | Disposition: A | Discharge status: Home / Self Care | Payer: BC Managed Care – PPO | Attending: Emergency Medicine | Admitting: Emergency Medicine

## 2023-03-22 DIAGNOSIS — N183 Chronic kidney disease, stage 3 unspecified: Secondary | ICD-10-CM | POA: Insufficient documentation

## 2023-03-22 DIAGNOSIS — R062 Wheezing: Secondary | ICD-10-CM | POA: Insufficient documentation

## 2023-03-22 DIAGNOSIS — Z01812 Encounter for preprocedural laboratory examination: Secondary | ICD-10-CM | POA: Diagnosis present

## 2023-03-22 DIAGNOSIS — I129 Hypertensive chronic kidney disease with stage 1 through stage 4 chronic kidney disease, or unspecified chronic kidney disease: Secondary | ICD-10-CM | POA: Diagnosis not present

## 2023-03-22 DIAGNOSIS — Z0189 Encounter for other specified special examinations: Secondary | ICD-10-CM

## 2023-03-22 DIAGNOSIS — Z79899 Other long term (current) drug therapy: Secondary | ICD-10-CM | POA: Insufficient documentation

## 2023-03-22 LAB — COMPREHENSIVE METABOLIC PANEL
ALT: 31 U/L (ref 0–44)
AST: 23 U/L (ref 15–41)
Albumin: 4.2 g/dL (ref 3.5–5.0)
Alkaline Phosphatase: 90 U/L (ref 38–126)
Anion gap: 12 (ref 5–15)
BUN: 16 mg/dL (ref 8–23)
CO2: 26 mmol/L (ref 22–32)
Calcium: 10.1 mg/dL (ref 8.9–10.3)
Chloride: 101 mmol/L (ref 98–111)
Creatinine, Ser: 1.27 mg/dL — ABNORMAL HIGH (ref 0.61–1.24)
GFR, Estimated: >60 mL/min (ref >60)
Glucose, Bld: 202 mg/dL — ABNORMAL HIGH (ref 70–99)
Potassium: 3.1 mmol/L — ABNORMAL LOW (ref 3.5–5.1)
Sodium: 139 mmol/L (ref 135–145)
Total Bilirubin: 0.6 mg/dL (ref <1.2)
Total Protein: 7.2 g/dL (ref 6.5–8.1)

## 2023-03-22 LAB — CBC WITH DIFFERENTIAL/PLATELET
Abs Immature Granulocytes: 0.01 10*3/uL (ref 0.00–0.07)
Basophils Absolute: 0 10*3/uL (ref 0.0–0.1)
Basophils Relative: 1 %
Eosinophils Absolute: 0.2 10*3/uL (ref 0.0–0.5)
Eosinophils Relative: 3 %
HCT: 40.7 % (ref 39.0–52.0)
Hemoglobin: 13.7 g/dL (ref 13.0–17.0)
Immature Granulocytes: 0 %
Lymphocytes Relative: 29 %
Lymphs Abs: 1.8 10*3/uL (ref 0.7–4.0)
MCH: 27.6 pg (ref 26.0–34.0)
MCHC: 33.7 g/dL (ref 30.0–36.0)
MCV: 82.1 fL (ref 80.0–100.0)
Monocytes Absolute: 0.4 10*3/uL (ref 0.1–1.0)
Monocytes Relative: 7 %
Neutro Abs: 3.8 10*3/uL (ref 1.7–7.7)
Neutrophils Relative %: 60 %
Platelets: 209 10*3/uL (ref 150–400)
RBC: 4.96 MIL/uL (ref 4.22–5.81)
RDW: 13.6 % (ref 11.5–15.5)
WBC: 6.2 10*3/uL (ref 4.0–10.5)
nRBC: 0 % (ref 0.0–0.2)

## 2023-03-22 LAB — I-STAT CHEM 8, ED
BUN: 16 mg/dL (ref 8–23)
Calcium, Ion: 1.15 mmol/L (ref 1.15–1.40)
Chloride: 104 mmol/L (ref 98–111)
Creatinine, Ser: 1.2 mg/dL (ref 0.61–1.24)
Glucose, Bld: 104 mg/dL — ABNORMAL HIGH (ref 70–99)
HCT: 46 % (ref 39.0–52.0)
Hemoglobin: 15.6 g/dL (ref 13.0–17.0)
Potassium: 2.8 mmol/L — ABNORMAL LOW (ref 3.5–5.1)
Sodium: 142 mmol/L (ref 135–145)
TCO2: 25 mmol/L (ref 22–32)

## 2023-03-22 LAB — TYPE AND SCREEN
ABO/RH(D): O POS
Antibody Screen: NEGATIVE

## 2023-03-22 MED ORDER — POTASSIUM CHLORIDE CRYS ER 20 MEQ PO TBCR
40.0000 meq | EXTENDED_RELEASE_TABLET | Freq: Once | ORAL | Status: AC
  Administered 2023-03-22: 40 meq via ORAL
  Filled 2023-03-22: qty 2

## 2023-03-22 NOTE — ED Provider Notes (Addendum)
[place] EMERGENCY DEPARTMENT AT [facility] Provider Note   CSN: [id number] Arrival date & time: 03/22/23  1519     History  Chief Complaint  Patient presents with   Abnormal Lab    Andrzej Preast is a 62 y.o. male with past medical history hypertension, hyperlipidemia, CKD 3, GERD, hemorrhoids presents to the ED after being called by his PCP for abnormal lab.  Patient had routine outpatient labs on 11/1 for hypertension and was called today due to hemoglobin of 6.7.  He denies any external bleeding, hematemesis or vomiting, melena or hematochezia, prior GI bleeding, blood in urine.  He denies chest pain.  Does endorse some shortness of breath but states it is only when he is asleep and wakes suddenly from sleep feeling short of breath.  Has not been evaluated with a sleep study previously.  No history of blood clots.  The history is provided by the patient and medical records.       Home Medications Prior to Admission medications   Medication Sig Start Date End Date Taking? Authorizing Provider  acetaminophen (TYLENOL) 500 MG tablet Take 2 tablets (1,000 mg total) by mouth every 6 (six) hours as needed. 01/08/22   Carlisle Beers, FNP  amLODipine (NORVASC) 10 MG tablet Take 1 tablet (10 mg total) by mouth at bedtime. 03/19/23   Ivonne Andrew, NP  atorvastatin (LIPITOR) 20 MG tablet Take 1 tablet (20 mg total) by mouth daily. 03/19/23   Ivonne Andrew, NP  benzonatate (TESSALON) 100 MG capsule Take 1 capsule (100 mg total) by mouth every 8 (eight) hours. Patient not taking: Reported on 03/19/2023 10/28/22   Ivonne Andrew, NP  Blood Pressure KIT 1 Units by Does not apply route daily. Patient not taking: Reported on 10/13/2022 06/25/22   Ivonne Andrew, NP  cetirizine (ZYRTEC) 10 MG tablet Take 1 tablet (10 mg total) by mouth daily. Patient not taking: Reported on 07/15/2022 01/08/22   Carlisle Beers, FNP  fluticasone [facility]) 50 MCG/ACT nasal spray Place 1  spray into both nostrils daily. 01/08/22   Carlisle Beers, FNP  guaiFENesin (MUCINEX) 600 MG 12 hr tablet Take 1 tablet (600 mg total) by mouth 2 (two) times daily for 7 days. 03/19/23 03/26/23  Ivonne Andrew, NP  hydrochlorothiazide (HYDRODIURIL) 25 MG tablet Take 1 tablet (25 mg total) by mouth daily. 03/19/23   Ivonne Andrew, NP  hydrocortisone (ANUSOL-HC) 25 MG suppository Place 1 suppository (25 mg total) rectally 2 (two) times daily. 03/19/23   Ivonne Andrew, NP  hydrocortisone (PROCTOZONE-HC) 2.5 % rectal cream Place 1 Application rectally 2 (two) times daily. 12/03/22   Ivonne Andrew, NP  ibuprofen (ADVIL) 600 MG tablet Take 1 tablet (600 mg total) by mouth every 8 (eight) hours as needed. Patient not taking: Reported on 10/13/2022 01/08/22   Carlisle Beers, FNP  meloxicam (MOBIC) 7.5 MG tablet Take 1 tablet (7.5 mg total) by mouth daily. Patient not taking: Reported on 07/15/2022 02/09/22   Ivonne Andrew, NP  olopatadine (PATANOL) 0.1 % ophthalmic solution Place 1 drop into both eyes 2 (two) times daily. 03/19/23   Ivonne Andrew, NP  pantoprazole (PROTONIX) 40 MG tablet Take 1 tablet (40 mg total) by mouth daily. Patient not taking: Reported on 09/17/2022 08/27/22   Ivonne Andrew, NP  predniSONE (DELTASONE) 20 MG tablet Take 1 tablet (20 mg total) by mouth daily with breakfast for 5 days. 03/19/23 03/24/23  Ivonne Andrew, NP  sildenafil (VIAGRA) 50 MG tablet Take 1 tablet (50 mg total) by mouth as needed for erectile dysfunction Patient not taking: Reported on 10/13/2022 09/29/22 09/29/23  Ivonne Andrew, NP  tiZANidine (ZANAFLEX) 4 MG tablet Take 1 tablet (4 mg total) by mouth every 6 (six) hours as needed for muscle spasms. 10/14/22   Ivonne Andrew, NP  valsartan (DIOVAN) 160 MG tablet Take 1 tablet (160 mg total) by mouth daily. 07/15/22   Corky Crafts, MD      Allergies    Patient has no known allergies.    Review of Systems   Review of Systems per  HPI above  Physical Exam Updated Vital Signs BP (!) 150/90   Pulse (!) 53   Temp 98.6 F (37 C)   Resp 14   SpO2 97%  Physical Exam Vitals and nursing note reviewed.  Constitutional:      Appearance: Normal appearance.  HENT:     Head: Normocephalic and atraumatic.     Nose: Nose normal.     Mouth/Throat:     Mouth: Mucous membranes are moist.  Eyes:     Extraocular Movements: Extraocular movements intact.     Pupils: Pupils are equal, round, and reactive to light.  Cardiovascular:     Rate and Rhythm: Normal rate and regular rhythm.     Pulses: Normal pulses.     Heart sounds: Normal heart sounds.  Pulmonary:     Effort: Pulmonary effort is normal.     Breath sounds: Normal breath sounds.  Abdominal:     General: There is no distension.     Palpations: Abdomen is soft.     Tenderness: There is no abdominal tenderness. There is no guarding.  Musculoskeletal:     Cervical back: Neck supple.     Right lower leg: No edema.     Left lower leg: No edema.  Skin:    General: Skin is warm.     Capillary Refill: Capillary refill takes less than 2 seconds.  Neurological:     General: No focal deficit present.     Mental Status: He is alert.     Sensory: No sensory deficit.     Motor: No weakness.     Coordination: Coordination normal.     ED Results / Procedures / Treatments   Labs (all labs ordered are listed, but only abnormal results are displayed) Labs Reviewed  COMPREHENSIVE METABOLIC PANEL - Abnormal; Notable for the following components:      Result Value   Potassium 3.1 (*)    Glucose, Bld 202 (*)    Creatinine, Ser 1.27 (*)    All other components within normal limits  I-STAT CHEM 8, ED - Abnormal; Notable for the following components:   Potassium 2.8 (*)    Glucose, Bld 104 (*)    All other components within normal limits  CBC WITH DIFFERENTIAL/PLATELET  TYPE AND SCREEN    EKG EKG Interpretation Date/Time:  Monday March 22 2023 16:12:14  EST Ventricular Rate:  102 PR Interval:  160 QRS Duration:  84 QT Interval:  346 QTC Calculation: 450 R Axis:   66  Text Interpretation: Sinus tachycardia with Premature atrial complexes with Abberant conduction Possible Left atrial enlargement ST & T wave abnormality, consider inferolateral ischemia Abnormal ECG When compared with ECG of 26-Sep-2021 14:14, PREVIOUS ECG IS PRESENT Confirmed by Blane Ohara [phone]) on 03/22/2023 8:40:29 PM  Radiology No results found.  Procedures Procedures  Medications Ordered in ED Medications - No data to display  ED Course/ Medical Decision Making/ A&P                                 Medical Decision Making Amount and/or Complexity of Data Reviewed Labs: ordered. Decision-making details documented in ED Course.   62 year old male presents after getting lab called back for low hemoglobin of 6.7 on outpatient labs.  Differential considered includes GI bleed, GU bleed, external bleed.  He however denies any symptoms that would suggest this.  Hemoglobin was checked here and was normal on CBC.  It was again rechecked several hours later and continues to be normal, 15.6.  His vitals are stable not consistent with acute blood loss or unstable hemorrhage.  I suspect his hemoglobin is 6.7 on outpatient labs may have been lab error.  Given reassuring and stable hemoglobin on 2 separate labs several hours apart, ongoing vital stability, absence of signs or symptoms of ongoing bleeding, he does not require admission and is safe for outpatient follow-up.  For his waking up at night short of breath I suspect based on his habitus that he may have underlying sleep apnea.  Recommended that he follow-up with his PCP to arrange possible outpatient sleep study.  I have a low suspicion this is PND related to underlying CHF given he is not clinically volume overloaded I have other symptoms such as DOE or chest pain or leg swelling.   He should follow-up with his PCP  for lab recheck.  Strict return precautions were discussed.  He was discharged from the ED in stable condition.         Final Clinical Impression(s) / ED Diagnoses Final diagnoses:  Encounter for laboratory test    Rx / DC Orders ED Discharge Orders     None         Karmen Stabs, MD 03/22/23 2203    Blane Ohara, MD 03/29/23 1538

## 2023-03-22 NOTE — ED Triage Notes (Signed)
PT reports he was sent by his PCP due to low Hgb of 6.7. Pt also reports SHOB.

## 2023-03-22 NOTE — Discharge Instructions (Signed)
You were seen today for low hemoglobin.  While you are here we repeated your labs which showed a normal hemoglobin on 2 separate labs several hours apart.  Is likely when your hemoglobin was 6.7 this was due to an error in the lab.  Please follow-up with your primary care doctor in 1 week for reevaluation.  If you develop any external bleeding, any blood in your stools or dark stools, or begin vomiting blood please return to the ED. Also return with chest pain, shortness of breath, or passing out.

## 2023-03-22 NOTE — ED Provider Triage Note (Signed)
Emergency Medicine Provider Triage Evaluation Note  David Owen , a 62 y.o. male  was evaluated in triage.  Pt complains of hemoglobin 6.7.  Review of Systems  Positive: Intermittent mild dizziness or weakness upon standing Negative: Headache, fatigue, lethargy, generalized weakness, hematuria, hemoptysis, melena, hematochezia  Physical Exam  BP (!) 146/96   Pulse (!) 109   Temp 98.5 F (36.9 C) (Oral)   Resp 16   SpO2 97%  Gen:   Awake, no distress  Resp:  Normal effort  MSK:   Moves extremities without difficulty  Other:  No abdominal tenderness or distention  Medical Decision Making  Medically screening exam initiated at 4:02 PM.  Appropriate orders placed.  Charm Barges Barona was informed that the remainder of the evaluation will be completed by another provider, this initial triage assessment does not replace that evaluation, and the importance of remaining in the ED until their evaluation is complete.  Patient labs done with primary care for essential hypertension March 19, 2023, labs show hemoglobin of 6.7.  Patient reports he has not had any near syncopal or syncopal events.  Has had some intermittent dizziness while walking.  Denies shortness of breath, headache, change in vision.  Patient has not noticed blood in stool or urine. Well appearing.    Smitty Knudsen, PA-C 03/22/23 1610

## 2023-04-21 ENCOUNTER — Encounter: Payer: Self-pay | Admitting: Nurse Practitioner

## 2023-05-05 ENCOUNTER — Other Ambulatory Visit: Payer: Self-pay

## 2023-05-06 ENCOUNTER — Other Ambulatory Visit: Payer: Self-pay

## 2023-06-03 ENCOUNTER — Other Ambulatory Visit: Payer: Self-pay

## 2023-06-16 ENCOUNTER — Other Ambulatory Visit: Payer: Self-pay

## 2023-06-17 ENCOUNTER — Other Ambulatory Visit: Payer: Self-pay

## 2023-06-25 ENCOUNTER — Other Ambulatory Visit: Payer: Self-pay

## 2023-06-28 ENCOUNTER — Other Ambulatory Visit: Payer: Self-pay

## 2023-07-13 ENCOUNTER — Other Ambulatory Visit: Payer: Self-pay

## 2023-07-14 ENCOUNTER — Other Ambulatory Visit: Payer: Self-pay

## 2023-07-23 ENCOUNTER — Other Ambulatory Visit: Payer: Self-pay | Admitting: Interventional Cardiology

## 2023-07-23 ENCOUNTER — Other Ambulatory Visit: Payer: Self-pay

## 2023-07-23 MED ORDER — VALSARTAN 160 MG PO TABS
160.0000 mg | ORAL_TABLET | Freq: Every day | ORAL | 0 refills | Status: DC
  Filled 2023-07-23: qty 30, 30d supply, fill #0

## 2023-08-10 ENCOUNTER — Other Ambulatory Visit: Payer: Self-pay

## 2023-08-11 ENCOUNTER — Other Ambulatory Visit: Payer: Self-pay

## 2023-08-24 ENCOUNTER — Other Ambulatory Visit: Payer: Self-pay

## 2023-08-24 ENCOUNTER — Other Ambulatory Visit: Payer: Self-pay | Admitting: Nurse Practitioner

## 2023-08-24 MED ORDER — VALSARTAN 160 MG PO TABS
160.0000 mg | ORAL_TABLET | Freq: Every day | ORAL | 0 refills | Status: DC
  Filled 2023-08-24: qty 30, 30d supply, fill #0

## 2023-08-30 ENCOUNTER — Other Ambulatory Visit: Payer: Self-pay

## 2023-08-30 ENCOUNTER — Other Ambulatory Visit: Payer: Self-pay | Admitting: Nurse Practitioner

## 2023-08-30 DIAGNOSIS — K649 Unspecified hemorrhoids: Secondary | ICD-10-CM

## 2023-08-30 MED ORDER — HYDROCORTISONE ACETATE 25 MG RE SUPP
25.0000 mg | Freq: Two times a day (BID) | RECTAL | 0 refills | Status: DC
  Filled 2023-08-30: qty 12, 6d supply, fill #0

## 2023-08-31 ENCOUNTER — Other Ambulatory Visit: Payer: Self-pay

## 2023-09-10 ENCOUNTER — Other Ambulatory Visit: Payer: Self-pay

## 2023-09-16 ENCOUNTER — Ambulatory Visit (INDEPENDENT_AMBULATORY_CARE_PROVIDER_SITE_OTHER): Payer: Self-pay | Admitting: Nurse Practitioner

## 2023-09-16 ENCOUNTER — Encounter: Payer: Self-pay | Admitting: Nurse Practitioner

## 2023-09-16 ENCOUNTER — Other Ambulatory Visit: Payer: Self-pay

## 2023-09-16 VITALS — BP 110/77 | HR 94 | Temp 98.1°F | Wt 208.2 lb

## 2023-09-16 DIAGNOSIS — Z13220 Encounter for screening for lipoid disorders: Secondary | ICD-10-CM | POA: Diagnosis not present

## 2023-09-16 DIAGNOSIS — I10 Essential (primary) hypertension: Secondary | ICD-10-CM | POA: Diagnosis not present

## 2023-09-16 DIAGNOSIS — K649 Unspecified hemorrhoids: Secondary | ICD-10-CM | POA: Diagnosis not present

## 2023-09-16 MED ORDER — HYDROCORTISONE ACETATE 25 MG RE SUPP
25.0000 mg | Freq: Two times a day (BID) | RECTAL | 0 refills | Status: DC
  Filled 2023-09-16: qty 12, 6d supply, fill #0

## 2023-09-16 MED ORDER — FLUTICASONE PROPIONATE 50 MCG/ACT NA SUSP
1.0000 | Freq: Every day | NASAL | 2 refills | Status: AC
  Filled 2023-09-16: qty 16, 34d supply, fill #0

## 2023-09-16 MED ORDER — OLOPATADINE HCL 0.1 % OP SOLN
1.0000 [drp] | Freq: Two times a day (BID) | OPHTHALMIC | 12 refills | Status: AC
  Filled 2023-09-16: qty 5, 25d supply, fill #0
  Filled 2024-06-01: qty 5, 25d supply, fill #1

## 2023-09-16 MED ORDER — CETIRIZINE HCL 10 MG PO TABS
10.0000 mg | ORAL_TABLET | Freq: Every day | ORAL | 2 refills | Status: AC
  Filled 2023-09-16: qty 30, 30d supply, fill #0

## 2023-09-16 NOTE — Progress Notes (Signed)
 Subjective   Patient ID: David Owen, male    DOB: May 01, 1961, 63 y.o.   MRN: [id number]  Chief Complaint  Patient presents with   Hypertension   Nasal Congestion    And in throat more at night     Referring provider: Jerrlyn Morel, NP  Catherin Closs Woznick is a 63 y.o. male with Past Medical History: No date: Acid reflux 01/2020: Chronic ischemic heart disease No date: Chronic kidney disease     Comment:  stage III No date: Constipation     Comment:  on miralax  daily  No date: Hemorrhoids No date: Hyperlipidemia No date: Hypertension 01/2020: Sinus tachycardia by electrocardiogram   HPI  Patient presents today for a follow-up visit.  His blood pressure is normal today.  Patient is compliant with medications.  Pharmacy is following with patient for medication management.  Denies f/c/s, n/v/d, hemoptysis, PND, leg swelling Denies chest pain or edema   Patient complains of ongoing sinus drainage and congestion from allergies.  Will refill Zyrtec  and Flonase .   Patient also complains of ongoing hemorrhoids.  We will refill suppository.      No Known Allergies  Immunization History  Administered Date(s) Administered   Hepatitis A, Adult 04/04/2020   Hepatitis B 01/24/2019   Influenza, Seasonal, Injecte, Preservative Fre 03/19/2023   Influenza,inj,Quad PF,6+ Mos 04/04/2020, 04/29/2020, 03/28/2021   MMR 01/24/2019   PFIZER(Purple Top)SARS-COV-2 Vaccination 01/25/2019, 02/29/2020   Td 01/24/2019    Tobacco History: Social History   Tobacco Use  Smoking Status Never  Smokeless Tobacco Never   Counseling given: Not Answered   Outpatient Encounter Medications as of 09/16/2023  Medication Sig   amLODipine  (NORVASC ) 10 MG tablet Take 1 tablet (10 mg total) by mouth at bedtime.   atorvastatin  (LIPITOR) 20 MG tablet Take 1 tablet (20 mg total) by mouth daily.   hydrochlorothiazide  (HYDRODIURIL ) 25 MG tablet Take 1 tablet (25 mg total) by mouth daily.    hydrocortisone  (PROCTOZONE -HC) 2.5 % rectal cream Place 1 Application rectally 2 (two) times daily.   olopatadine  (PATADAY ) 0.1 % ophthalmic solution Place 1 drop into both eyes 2 (two) times daily.   tiZANidine  (ZANAFLEX ) 4 MG tablet Take 1 tablet (4 mg total) by mouth every 6 (six) hours as needed for muscle spasms.   valsartan  (DIOVAN ) 160 MG tablet Take 1 tablet (160 mg total) by mouth daily.   [DISCONTINUED] fluticasone  (FLONASE ) 50 MCG/ACT nasal spray Place 1 spray into both nostrils daily.   [DISCONTINUED] hydrocortisone  (ANUSOL -HC) 25 MG suppository Place 1 suppository (25 mg total) rectally 2 (two) times daily.   acetaminophen  (TYLENOL ) 500 MG tablet Take 2 tablets (1,000 mg total) by mouth every 6 (six) hours as needed.   benzonatate  (TESSALON ) 100 MG capsule Take 1 capsule (100 mg total) by mouth every 8 (eight) hours. (Patient not taking: Reported on 09/16/2023)   Blood Pressure KIT 1 Units by Does not apply route daily. (Patient not taking: Reported on 09/16/2023)   cetirizine  (ZYRTEC ) 10 MG tablet Take 1 tablet (10 mg total) by mouth daily.   fluticasone  (FLONASE ) 50 MCG/ACT nasal spray Place 1 spray into both nostrils daily.   hydrocortisone  (ANUSOL -HC) 25 MG suppository Place 1 suppository (25 mg total) rectally 2 (two) times daily.   ibuprofen  (ADVIL ) 600 MG tablet Take 1 tablet (600 mg total) by mouth every 8 (eight) hours as needed. (Patient not taking: Reported on 09/16/2023)   meloxicam  (MOBIC ) 7.5 MG tablet Take 1 tablet (7.5 mg total) by  mouth daily. (Patient not taking: Reported on 09/16/2023)   olopatadine  (PATANOL) 0.1 % ophthalmic solution Place 1 drop into both eyes 2 (two) times daily.   pantoprazole  (PROTONIX ) 40 MG tablet Take 1 tablet (40 mg total) by mouth daily. (Patient not taking: Reported on 09/16/2023)   sildenafil  (VIAGRA ) 50 MG tablet Take 1 tablet (50 mg total) by mouth as needed for erectile dysfunction (Patient not taking: Reported on 09/16/2023)   [DISCONTINUED]  cetirizine  (ZYRTEC ) 10 MG tablet Take 1 tablet (10 mg total) by mouth daily. (Patient not taking: Reported on 09/16/2023)   No facility-administered encounter medications on file as of 09/16/2023.    Review of Systems  Review of Systems  Constitutional: Negative.   HENT: Negative.    Cardiovascular: Negative.   Gastrointestinal: Negative.   Allergic/Immunologic: Negative.   Neurological: Negative.   Psychiatric/Behavioral: Negative.       Objective:   BP 110/77   Pulse 94   Temp 98.1 F (36.7 C)   Wt 208 lb 3.2 oz (94.4 kg)   SpO2 100%   BMI 36.88 kg/m   Wt Readings from Last 5 Encounters:  09/16/23 208 lb 3.2 oz (94.4 kg)  03/19/23 208 lb (94.3 kg)  09/17/22 199 lb 3.2 oz (90.4 kg)  07/15/22 193 lb 9.6 oz (87.8 kg)  07/23/22 193 lb 14.4 oz (88 kg)     Physical Exam Vitals and nursing note reviewed.  Constitutional:      General: He is not in acute distress.    Appearance: He is well-developed.  Cardiovascular:     Rate and Rhythm: Normal rate and regular rhythm.  Pulmonary:     Effort: Pulmonary effort is normal.     Breath sounds: Normal breath sounds.  Skin:    General: Skin is warm and dry.  Neurological:     Mental Status: He is alert and oriented to person, place, and time.       Assessment & Plan:   Essential hypertension -     CBC -     Comprehensive metabolic panel with GFR  Hemorrhoids, unspecified hemorrhoid type -     Hydrocortisone  Acetate; Place 1 suppository (25 mg total) rectally 2 (two) times daily.  Dispense: 12 suppository; Refill: 0  Lipid screening -     Lipid panel  Other orders -     Fluticasone  Propionate; Place 1 spray into both nostrils daily.  Dispense: 16 g; Refill: 2 -     Cetirizine  HCl; Take 1 tablet (10 mg total) by mouth daily.  Dispense: 30 tablet; Refill: 2 -     Olopatadine  HCl; Place 1 drop into both eyes 2 (two) times daily.  Dispense: 5 mL; Refill: 12     Return in about 6 months (around 03/18/2024).    Jerrlyn Morel, NP 09/16/2023

## 2023-09-16 NOTE — Patient Instructions (Signed)
 1. Hemorrhoids, unspecified hemorrhoid type  - hydrocortisone  (ANUSOL -HC) 25 MG suppository; Place 1 suppository (25 mg total) rectally 2 (two) times daily.  Dispense: 12 suppository; Refill: 0  2. Essential hypertension (Primary)  - CBC - Comprehensive metabolic panel with GFR  3. Lipid screening  - Lipid Panel

## 2023-09-17 LAB — LIPID PANEL
Chol/HDL Ratio: 3.4 ratio (ref 0.0–5.0)
Cholesterol, Total: 147 mg/dL (ref 100–199)
HDL: 43 mg/dL (ref >39)
LDL Chol Calc (NIH): 82 mg/dL (ref 0–99)
Triglycerides: 123 mg/dL (ref 0–149)
VLDL Cholesterol Cal: 22 mg/dL (ref 5–40)

## 2023-09-17 LAB — CBC
Hematocrit: 40.2 % (ref 37.5–51.0)
Hemoglobin: 13.5 g/dL (ref 13.0–17.7)
MCH: 28.3 pg (ref 26.6–33.0)
MCHC: 33.6 g/dL (ref 31.5–35.7)
MCV: 84 fL (ref 79–97)
Platelets: 234 10*3/uL (ref 150–450)
RBC: 4.77 x10E6/uL (ref 4.14–5.80)
RDW: 14.2 % (ref 11.6–15.4)
WBC: 7.5 10*3/uL (ref 3.4–10.8)

## 2023-09-17 LAB — COMPREHENSIVE METABOLIC PANEL WITH GFR
ALT: 35 IU/L (ref 0–44)
AST: 24 IU/L (ref 0–40)
Albumin: 5.1 g/dL — ABNORMAL HIGH (ref 3.9–4.9)
Alkaline Phosphatase: 116 IU/L (ref 44–121)
BUN/Creatinine Ratio: 14 (ref 10–24)
BUN: 17 mg/dL (ref 8–27)
Bilirubin Total: 0.4 mg/dL (ref 0.0–1.2)
CO2: 25 mmol/L (ref 20–29)
Calcium: 10.3 mg/dL — ABNORMAL HIGH (ref 8.6–10.2)
Chloride: 100 mmol/L (ref 96–106)
Creatinine, Ser: 1.22 mg/dL (ref 0.76–1.27)
Globulin, Total: 2.3 g/dL (ref 1.5–4.5)
Glucose: 126 mg/dL — ABNORMAL HIGH (ref 70–99)
Potassium: 3.3 mmol/L — ABNORMAL LOW (ref 3.5–5.2)
Sodium: 143 mmol/L (ref 134–144)
Total Protein: 7.4 g/dL (ref 6.0–8.5)
eGFR: 67 mL/min/{1.73_m2} (ref >59)

## 2023-09-23 ENCOUNTER — Other Ambulatory Visit: Payer: Self-pay

## 2023-09-23 ENCOUNTER — Other Ambulatory Visit: Payer: Self-pay | Admitting: Nurse Practitioner

## 2023-09-23 DIAGNOSIS — K649 Unspecified hemorrhoids: Secondary | ICD-10-CM

## 2023-09-23 MED ORDER — HYDROCORTISONE ACETATE 25 MG RE SUPP
25.0000 mg | Freq: Two times a day (BID) | RECTAL | 0 refills | Status: DC
  Filled 2023-09-23: qty 12, 6d supply, fill #0

## 2023-09-23 NOTE — Telephone Encounter (Signed)
 Please advise [facility]

## 2023-09-24 ENCOUNTER — Other Ambulatory Visit: Payer: Self-pay | Admitting: Nurse Practitioner

## 2023-09-24 ENCOUNTER — Other Ambulatory Visit: Payer: Self-pay

## 2023-09-27 ENCOUNTER — Other Ambulatory Visit: Payer: Self-pay | Admitting: Nurse Practitioner

## 2023-09-27 ENCOUNTER — Telehealth: Payer: Self-pay | Admitting: Nurse Practitioner

## 2023-09-27 ENCOUNTER — Other Ambulatory Visit: Payer: Self-pay

## 2023-09-27 NOTE — Telephone Encounter (Unsigned)
 Copied from CRM [id number]. Topic: Clinical - Medication Refill >> Sep 27, 2023  2:07 PM Elle L wrote: Medication: valsartan  (DIOVAN ) 160 MG tablet   Has the patient contacted their pharmacy? Yes  This is the patient's preferred pharmacy:  [facility] [address], [address] [place] [address] Phone: [phone] Fax: [phone]  Is this the correct pharmacy for this prescription? Yes  Has the prescription been filled recently? No  Is the patient out of the medication? Yes  Has the patient been seen for an appointment in the last year OR does the patient have an upcoming appointment? Yes  Can we respond through MyChart? Yes  Agent: Please be advised that Rx refills may take up to 3 business days. We ask that you follow-up with your pharmacy.

## 2023-09-28 ENCOUNTER — Other Ambulatory Visit: Payer: Self-pay

## 2023-09-28 MED ORDER — VALSARTAN 160 MG PO TABS
160.0000 mg | ORAL_TABLET | Freq: Every day | ORAL | 0 refills | Status: DC
  Filled 2023-09-28: qty 30, 30d supply, fill #0

## 2023-09-29 ENCOUNTER — Ambulatory Visit: Payer: Self-pay

## 2023-10-07 ENCOUNTER — Other Ambulatory Visit: Payer: Self-pay

## 2023-10-14 ENCOUNTER — Other Ambulatory Visit: Payer: Self-pay

## 2023-10-25 ENCOUNTER — Other Ambulatory Visit: Payer: Self-pay | Admitting: Nurse Practitioner

## 2023-10-25 ENCOUNTER — Other Ambulatory Visit: Payer: Self-pay

## 2023-10-25 MED ORDER — VALSARTAN 160 MG PO TABS
160.0000 mg | ORAL_TABLET | Freq: Every day | ORAL | 0 refills | Status: DC
  Filled 2023-10-25: qty 30, 30d supply, fill #0

## 2023-10-29 ENCOUNTER — Other Ambulatory Visit: Payer: Self-pay

## 2023-11-09 ENCOUNTER — Other Ambulatory Visit: Payer: Self-pay | Admitting: Nurse Practitioner

## 2023-11-09 ENCOUNTER — Other Ambulatory Visit: Payer: Self-pay

## 2023-11-09 MED ORDER — ATORVASTATIN CALCIUM 20 MG PO TABS
20.0000 mg | ORAL_TABLET | Freq: Every day | ORAL | 1 refills | Status: DC
  Filled 2023-11-09: qty 90, 90d supply, fill #0
  Filled 2024-02-21: qty 90, 90d supply, fill #1

## 2023-11-12 ENCOUNTER — Other Ambulatory Visit: Payer: Self-pay

## 2023-11-16 ENCOUNTER — Other Ambulatory Visit: Payer: Self-pay

## 2023-11-17 ENCOUNTER — Other Ambulatory Visit: Payer: Self-pay

## 2023-11-25 ENCOUNTER — Other Ambulatory Visit: Payer: Self-pay

## 2023-11-25 ENCOUNTER — Other Ambulatory Visit: Payer: Self-pay | Admitting: Nurse Practitioner

## 2023-11-25 MED ORDER — VALSARTAN 160 MG PO TABS
160.0000 mg | ORAL_TABLET | Freq: Every day | ORAL | 0 refills | Status: DC
  Filled 2023-11-25: qty 30, 30d supply, fill #0

## 2023-11-26 ENCOUNTER — Other Ambulatory Visit: Payer: Self-pay

## 2023-12-03 ENCOUNTER — Other Ambulatory Visit: Payer: Self-pay | Admitting: Nurse Practitioner

## 2023-12-03 ENCOUNTER — Other Ambulatory Visit: Payer: Self-pay

## 2023-12-03 DIAGNOSIS — I10 Essential (primary) hypertension: Secondary | ICD-10-CM

## 2023-12-03 MED ORDER — HYDROCHLOROTHIAZIDE 25 MG PO TABS
25.0000 mg | ORAL_TABLET | Freq: Every day | ORAL | 1 refills | Status: DC
  Filled 2023-12-03: qty 90, 90d supply, fill #0
  Filled 2024-03-06: qty 90, 90d supply, fill #1

## 2023-12-20 ENCOUNTER — Other Ambulatory Visit: Payer: Self-pay

## 2023-12-21 ENCOUNTER — Other Ambulatory Visit: Payer: Self-pay

## 2023-12-29 ENCOUNTER — Other Ambulatory Visit: Payer: Self-pay

## 2023-12-29 ENCOUNTER — Other Ambulatory Visit: Payer: Self-pay | Admitting: Nurse Practitioner

## 2023-12-29 MED ORDER — VALSARTAN 160 MG PO TABS
160.0000 mg | ORAL_TABLET | Freq: Every day | ORAL | 0 refills | Status: DC
  Filled 2023-12-29 (×2): qty 30, 30d supply, fill #0

## 2024-01-25 ENCOUNTER — Other Ambulatory Visit: Payer: Self-pay

## 2024-01-25 ENCOUNTER — Other Ambulatory Visit: Payer: Self-pay | Admitting: Nurse Practitioner

## 2024-01-25 MED ORDER — VALSARTAN 160 MG PO TABS
160.0000 mg | ORAL_TABLET | Freq: Every day | ORAL | 0 refills | Status: DC
  Filled 2024-01-25: qty 30, 30d supply, fill #0

## 2024-02-21 ENCOUNTER — Other Ambulatory Visit: Payer: Self-pay

## 2024-02-28 ENCOUNTER — Other Ambulatory Visit: Payer: Self-pay

## 2024-03-06 ENCOUNTER — Other Ambulatory Visit: Payer: Self-pay

## 2024-03-06 ENCOUNTER — Other Ambulatory Visit: Payer: Self-pay | Admitting: Nurse Practitioner

## 2024-03-06 MED ORDER — VALSARTAN 160 MG PO TABS
160.0000 mg | ORAL_TABLET | Freq: Every day | ORAL | 0 refills | Status: DC
  Filled 2024-03-06: qty 30, 30d supply, fill #0

## 2024-03-07 ENCOUNTER — Other Ambulatory Visit: Payer: Self-pay

## 2024-03-20 ENCOUNTER — Encounter: Payer: Self-pay | Admitting: Nurse Practitioner

## 2024-03-20 ENCOUNTER — Ambulatory Visit (INDEPENDENT_AMBULATORY_CARE_PROVIDER_SITE_OTHER): Payer: Self-pay | Admitting: Nurse Practitioner

## 2024-03-20 ENCOUNTER — Other Ambulatory Visit: Payer: Self-pay

## 2024-03-20 VITALS — BP 150/81 | HR 80 | Wt 207.0 lb

## 2024-03-20 DIAGNOSIS — M25562 Pain in left knee: Secondary | ICD-10-CM | POA: Diagnosis not present

## 2024-03-20 DIAGNOSIS — M5441 Lumbago with sciatica, right side: Secondary | ICD-10-CM | POA: Diagnosis not present

## 2024-03-20 DIAGNOSIS — I10 Essential (primary) hypertension: Secondary | ICD-10-CM | POA: Diagnosis not present

## 2024-03-20 DIAGNOSIS — M25561 Pain in right knee: Secondary | ICD-10-CM

## 2024-03-20 DIAGNOSIS — G8929 Other chronic pain: Secondary | ICD-10-CM

## 2024-03-20 DIAGNOSIS — M5442 Lumbago with sciatica, left side: Secondary | ICD-10-CM | POA: Diagnosis not present

## 2024-03-20 MED ORDER — ONDANSETRON 4 MG PO TBDP
4.0000 mg | ORAL_TABLET | Freq: Three times a day (TID) | ORAL | 0 refills | Status: DC | PRN
  Filled 2024-03-20: qty 20, 7d supply, fill #0

## 2024-03-20 MED ORDER — HYDROCHLOROTHIAZIDE 25 MG PO TABS
25.0000 mg | ORAL_TABLET | Freq: Every day | ORAL | 1 refills | Status: DC
  Filled 2024-03-20 – 2024-05-01 (×2): qty 90, 90d supply, fill #0

## 2024-03-20 MED ORDER — PREDNISONE 20 MG PO TABS
20.0000 mg | ORAL_TABLET | Freq: Every day | ORAL | 0 refills | Status: DC
  Filled 2024-03-20: qty 5, 5d supply, fill #0

## 2024-03-20 MED ORDER — AMLODIPINE BESYLATE 10 MG PO TABS
10.0000 mg | ORAL_TABLET | Freq: Every day | ORAL | 3 refills | Status: AC
  Filled 2024-03-20 – 2024-04-03 (×2): qty 90, 90d supply, fill #0

## 2024-03-20 MED ORDER — ATORVASTATIN CALCIUM 20 MG PO TABS
20.0000 mg | ORAL_TABLET | Freq: Every day | ORAL | 1 refills | Status: AC
  Filled 2024-03-20 – 2024-05-26 (×2): qty 90, 90d supply, fill #0

## 2024-03-20 MED ORDER — KETOROLAC TROMETHAMINE 60 MG/2ML IM SOLN
60.0000 mg | Freq: Once | INTRAMUSCULAR | Status: AC
  Administered 2024-03-20: 60 mg via INTRAMUSCULAR

## 2024-03-20 NOTE — Patient Instructions (Signed)
Rgime alimentaire DASH DASH Eating Plan DASH est l'abrviation de  Dietary Approaches to Stop Hypertension  (approches dittiques pour arrter l'hypertension). Le rgime alimentaire DASH est un plan d'alimentation sain qui s'est avr efficace pour : Faire baisser une tension artrielle leve (hypertension). Rduire [employer] de souffrir de diabte de type 2, de maladie cardiaque et d'accident vasculaire crbral. Publishing rights manager la perte de poids. Comment bien suivre ce rgime ? Lire les tiquettes affiches sur les emballages des produits alimentaires Vrifiez, sur les tiquettes des [employer] alimentaires, la quantit de sel (sodium) contenue dans chaque portion. Choisissez des aliments dont la valeur nutritionnelle de rfrence (VNR) en sodium est infrieure  5 %. En gnral, les aliments qui contiennent moins de 300 milligrammes (mg) de sodium par portion peuvent tre intgrs  ce plan d'alimentation. Pour trouver les crales compltes, Investment banker, corporate  complet(te)  en premire position [employer] ingrdients. Faire les courses Achetez des produits tiquets  faible teneur en sodium  ou  sans sel ajout . Achetez des aliments frais. vitez les aliments en conserve et les repas prpars ou surgels. Cuisiner Dans la [employer] possible, ne salez pas les plats que vous cuisinez. Utilisez des assaisonnements ou des herbes sans sel au lieu du sel de table ou du sel de mer. Consultez votre prestataire de soins de sant ou votre pharmacien avant d'utiliser des substituts de sel. Ne faites pas frire les aliments. Cuisinez les aliments en utilisant des modes de [address], par exemple, la [employer] four, le pochage, la McKesson four ou sur le gril et le rtissage. Cuisinez en utilisant des [employer] cour. Vous pouvez, notamment, utiliser de [address], de [place], [place], de soja et de tournesol. Planifier les repas  Adoptez une alimentation  quilibre. Il est conseill de consommer : Au moins 4 portions de fruits et 4 portions de lgumes par jour. Essayez de remplir la moiti de votre assiette de fruits et de lgumes. 6  8 portions de crales compltes par jour. Tout au plus 6 portions de viande maigre, de volaille ou de poisson par [employer]. Une portion correspond  1 once (28 g). Une portion de viande de 3 onces (85 g) correspond  peu prs  la taille de la paume de la main. Un ouf quivaut  1 once (28 g). 2  3 portions de produits laitiers pauvres en matires grasses par [employer]. Une portion correspond  1 tasse (237 ml). 1 portion de fruits  coque, graines ou haricots secs 5 fois par semaine. 2  3 portions de graisses bonnes pour [employer]. Les graisses bonnes pour la sant, appeles acides gras omga-3, se trouvent dans des aliments tels que les noix, les graines de lin, les laits enrichis et les oufs. Ces graisses sont galement prsentes dans les poissons d'eaux froides, comme les sardines, le saumon et Hershey Company. Limitez votre consommation : D'aliments en conserve ou premballs. D'aliments riches en graisse trans, tels que les aliments frits. D'aliments riches en graisses satures, tels que la viande grasse. De desserts et sucreries, de boissons sucres et d'autres aliments contenant du sucre ajout. De produits laitiers entiers. N'ajoutez pas de sel  vos aliments avant de manger. Ne mangez pas plus de 4 jaunes d'ouf par [employer]. Essayez de consommer au moins 2 repas vgtariens par semaine. Privilgiez les aliments prpars  la maison aux aliments de restaurant, de buffet et de restauration rapide. Mode de vie Lorsque vous [employer] un restaurant, [employer] s'il  est possible de prparer votre repas avec moins de sel, voire sans sel. Si vous consommez de l'alcool : Limitez votre consommation  : 1 verre par jour si vous tes une femme. 2 verres par jour si vous tes un homme. Sachez quelle quantit d'alcool est  contenue dans votre verre. Aux tats[place], un verre correspond  une bouteille de bire de 355 ml (12 onces),  un verre de vin de 148 ml (5 onces) ou  un verre  liqueur d'alcool fort de 44 ml (1,5 once). Informations gnrales vitez de consommer plus de 2 300 mg de sel par jour. Si vous souffrez d'hypertension, vous devrez peut-tre limiter votre apport de sodium  1 500 mg par jour. Demandez l'aide de votre prestataire pour [employer] un poids sant ou pour [employer] Corporation. Demandez-lui de vous indiquer la fourchette de poids qui vous convient le mieux. Chaque jour ou presque, faites au moins 30 minutes d'exercice physique qui fasse battre votre cour plus rapidement. Vous pourriez, par [employer], Therapist, music, nager ou faire du vlo. Consultez votre prestataire ou  votre ditticien pour Walt Disney d'alimentation  vos besoins nergtiques. Quels aliments devrais-je manger ? Fruits Tous les fruits frais, schs ou congels. Fruits en conserve dans leur jus naturel et sans sucres ajouts. Lgumes Lgumes frais ou congels, crus,  la vapeur, rtis ou grills. Jus de tomates et de [employer] teneur en sodium ou  teneur en sodium rduite. Sauce tomate ou pte de tomates  faible teneur en sodium ou  teneur en sodium rduite. Lgumes en conserve  faible teneur en sodium ou  teneur en sodium rduite. Crales Pain au bl entier ou aux crales compltes. Ptes au bl entier ou aux crales compltes. Riz brun. Farine d'avoine. Quinoa. Boulgour. Crales compltes et pauvres en sodium. Pain pita. Craquelins pauvres en graisses et en sodium. Tortillas de farine de bl entier. Viandes et autres protines Poulet ou dinde sans la peau. Poulet ou dinde hachs. Porc dgraiss. Poisson et fruits de mer. Blancs d'oufs. Lgumineuses, lentilles ou pois secs. Fruits  coque non sals, beurres de fruits  coque et graines. Haricots secs en conserve non sals. Tranches maigres de bouf  dgraiss. Viandes prcuites et charcuterie Val[employer] teneur en sodium, comme les saucisses ou les pains de viande. Produits laitiers Lait  faible teneur en matire grasse (1 %) ou crm. Fromages  teneur en matires grasses rduite,  faible teneur en matires grasses ou sans matires grasses. Ricotta ou cottage cheese sans matires grasses et pauvres en sodium. [place] sans matires grasses ou pauvre en matire grasse. Fromage pauvre en matires grasses et en sodium. Graisses et huiles Margarine molle sans graisses trans. Huile vgtale. Mayonnaises et vinaigrettes  teneur en matires grasses rduite,  faible teneur en matires grasses ou allges ( teneur en sodium rduite). Huiles de colza, de carthame, d'olive, d'avocat, de soja et de tournesol. Avocats. Assaisonnements et condiments Aromates. pices. Mlanges d'assaisonnement sans sel. Autres aliments Popcorn et bretzels non sals. Bonbons sans [employer]. Les aliments et boissons mentionns ci-dessus ne constituent pas ncessairement une liste exhaustive de ceux que vous pouvez consommer. Pour de plus amples informations, consultez un ditticien. Quels aliments devrais-je viter ? Fruits Fruits en conserve dans un sirop lger ou pais. Fruits frits. Fruits dans une sauce  la crme ou au beurre. Lgumes Lgumes frits ou  la crme. Lgumes dans une sauce au fromage. Lgumes en conserve ordinaire et Nuc[employer] Corporation ne sont pas tiquets comme ayant [place]  teneur en sodium ou une teneur rduite en sodium. Sauces tomates et ptes ordinaires en conserve et [employer]ucor Corporation ne sont pas tiquetes comme ayant une faible teneur en sodium ou une teneur rduite en sodium. Jus de tomates et de lgumes ordinaires et qui ne sont pas tiquets comme ayant une faible teneur en sodium ou une teneur rduite en sodium. Cornichons. Olives. Crales Produits de boulangerie contenant des matires grasses, comme les croissants, les muffins ou certains pains. Paquets  de ptes ou de riz lyophiliss. Viandes et autres protines Pices de viande grasses. Ctes. Viande frite. Tomasa Blase. Saucisson de [place]olog[place], salami et autres viandes prcuites ou charcuterie, comme les saucisses ou les pains de viande, et qui ne sont ni maigres ni  faible teneur en sodium. Lard provenant du [employer] (bardire). [place]. Fruits  coque et Raytheon. Haricots secs en conserve contenant du sel ajout. Poisson en conserve ou fum. Oufs entiers ou jaunes d'oufs. Poulet ou dinde avec la peau. Produits laitiers Lait entier ou  2 %, crme et lait demi-crm. Fromages entiers ou gras. Yaourts entiers ou sucrs. Fromages gras. Crmes non laitires. Nappages fouetts. Tartinades de fromage et de fromage fondu. Graisses et Walgreen. Margarine en btonnets. Lard. Graisse. Ghee. Gras de bacon. Huiles tropicales telles que l'huile de coco, de palmiste ou de palme. Assaisonnements et condiments Sel d'oignon et d'ail, sel assaisonn, sel de table et sel de mer. Sauce Worcestershire. Sauce tartare. Sauce barbecue. Sauce teriyaki. Sauces soja, y compris celles  teneur rduite en sodium. Sauce pour steak. Sauces en conserve et emballes. Sauce pour poisson. Sauce aux hutres. Sauce cocktail. Raifort achet en magasin. Ketchup. Moutarde. Armes et attendrisseurs de viande. Cubes de bouillon. Sauces piquantes. Marinades prpares ou premballes. Assaisonnements pour tacos prpars ou premballs. Relishs. Vinaigrettes ordinaires. Autres aliments Popcorn et bretzels sals. Les aliments et boissons mentionns ci-dessus ne constituent pas ncessairement une liste exhaustive de ceux que vous ne devriez pas consommer. Pour de plus amples informations, consultez un ditticien. Pour obtenir plus d'informations  BJ's, Lung, and Blood Institute (NHLBI) ([employer] cour, des poumons [employer] sang) : BuffaloDryCleaner.gl American Heart Association (AHA) (Association amricaine de cardiologie)  : heart.org Academy of Nutrition and Dietetics (Acadmie de nutrition et de dittique) : eatright.org National Kidney Foundation (NKF) (Fondation nationale du rein) : kidney.org Ces conseils et renseignements ne sauraient se substituer  l'avis mdical de votre prestataire de soins de sant. Par consquent, il est primordial de parler de toutes vos proccupations avec votre prestataire de soins de sant. Document Revised: 06/17/2022 Document Reviewed: 06/17/2022 Elsevier Patient Education  2024 [employer].

## 2024-03-20 NOTE — Progress Notes (Signed)
 Subjective   Patient ID: David Owen, male    DOB: Oct 17, 1960, 63 y.o.   MRN: [id number]  Chief Complaint  Patient presents with   6 month follow up    Hypertension    Referring provider: Oley Bascom RAMAN, NP  Chaney Carlis Stamour is a 63 y.o. male with Past Medical History: No date: Acid reflux 01/2020: Chronic ischemic heart disease No date: Chronic kidney disease     Comment:  stage III No date: Constipation     Comment:  on miralax  daily  No date: Hemorrhoids No date: Hyperlipidemia No date: Hypertension 01/2020: Sinus tachycardia by electrocardiogram   HPI  Patient presents today for a follow-up visit.  His blood pressure is normal today.  Patient is compliant with medications.  Pharmacy is following with patient for medication management. Has sciatica with bilateral leg pain. Denies f/c/s, n/v/d, hemoptysis, PND, leg swelling. Denies chest pain or edema.  Note: Patient complains today of intermittent nausea.  He also complains of bilateral knee pain and low back pain with bilateral sciatica.  We will trial Zofran and prednisone .  Will give Toradol injection in office today.  No Known Allergies  Immunization History  Administered Date(s) Administered   Hepatitis A, Adult 04/04/2020   Hepatitis B 01/24/2019   Influenza, Seasonal, Injecte, Preservative Fre 03/19/2023   Influenza,inj,Quad PF,6+ Mos 04/04/2020, 04/29/2020, 03/28/2021   MMR 01/24/2019   PFIZER(Purple Top)SARS-COV-2 Vaccination 01/25/2019, 02/29/2020   Td 01/24/2019    Tobacco History: Social History   Tobacco Use  Smoking Status Never  Smokeless Tobacco Never   Counseling given: Not Answered   Outpatient Encounter Medications as of 03/20/2024  Medication Sig   acetaminophen  (TYLENOL ) 500 MG tablet Take 2 tablets (1,000 mg total) by mouth every 6 (six) hours as needed.   cetirizine  (ZYRTEC ) 10 MG tablet Take 1 tablet (10 mg total) by mouth daily.   fluticasone  (FLONASE ) 50 MCG/ACT nasal spray  Place 1 spray into both nostrils daily.   hydrocortisone  (ANUSOL -HC) 25 MG suppository Place 1 suppository (25 mg total) rectally 2 (two) times daily.   hydrocortisone  (PROCTOZONE -HC) 2.5 % rectal cream Place 1 Application rectally 2 (two) times daily.   olopatadine  (PATADAY ) 0.1 % ophthalmic solution Place 1 drop into both eyes 2 (two) times daily.   olopatadine  (PATANOL) 0.1 % ophthalmic solution Place 1 drop into both eyes 2 (two) times daily.   ondansetron (ZOFRAN-ODT) 4 MG disintegrating tablet Take 1 tablet (4 mg total) by mouth every 8 (eight) hours as needed.   predniSONE  (DELTASONE ) 20 MG tablet Take 1 tablet (20 mg total) by mouth daily with breakfast.   tiZANidine  (ZANAFLEX ) 4 MG tablet Take 1 tablet (4 mg total) by mouth every 6 (six) hours as needed for muscle spasms.   valsartan  (DIOVAN ) 160 MG tablet Take 1 tablet (160 mg total) by mouth daily.Please call our office to schedule an overdue appointment with new Cardiologist before anymore refills or refill with PCP.  [phone]. Thank you 1st attempt   [DISCONTINUED] amLODipine  (NORVASC ) 10 MG tablet Take 1 tablet (10 mg total) by mouth at bedtime.   [DISCONTINUED] atorvastatin  (LIPITOR) 20 MG tablet Take 1 tablet (20 mg total) by mouth daily.   [DISCONTINUED] hydrochlorothiazide  (HYDRODIURIL ) 25 MG tablet Take 1 tablet (25 mg total) by mouth daily.   amLODipine  (NORVASC ) 10 MG tablet Take 1 tablet (10 mg total) by mouth at bedtime.   atorvastatin  (LIPITOR) 20 MG tablet Take 1 tablet (20 mg total) by mouth daily.  benzonatate  (TESSALON ) 100 MG capsule Take 1 capsule (100 mg total) by mouth every 8 (eight) hours. (Patient not taking: Reported on 03/20/2024)   Blood Pressure KIT 1 Units by Does not apply route daily. (Patient not taking: Reported on 03/20/2024)   hydrochlorothiazide  (HYDRODIURIL ) 25 MG tablet Take 1 tablet (25 mg total) by mouth daily.   ibuprofen  (ADVIL ) 600 MG tablet Take 1 tablet (600 mg total) by mouth every 8  (eight) hours as needed. (Patient not taking: Reported on 03/20/2024)   meloxicam  (MOBIC ) 7.5 MG tablet Take 1 tablet (7.5 mg total) by mouth daily. (Patient not taking: Reported on 03/20/2024)   pantoprazole  (PROTONIX ) 40 MG tablet Take 1 tablet (40 mg total) by mouth daily. (Patient not taking: Reported on 03/20/2024)   sildenafil  (VIAGRA ) 50 MG tablet Take 1 tablet (50 mg total) by mouth as needed for erectile dysfunction (Patient not taking: Reported on 03/20/2024)   Facility-Administered Encounter Medications as of 03/20/2024  Medication   ketorolac (TORADOL) injection 60 mg    Review of Systems  Review of Systems  Constitutional: Negative.   HENT: Negative.    Cardiovascular: Negative.   Gastrointestinal: Negative.   Allergic/Immunologic: Negative.   Neurological: Negative.   Psychiatric/Behavioral: Negative.       Objective:   BP (!) 144/80 (BP Location: Right Arm, Patient Position: Sitting, Cuff Size: Large)   Pulse 87   Wt 207 lb (93.9 kg)   SpO2 99%   BMI 36.67 kg/m   Wt Readings from Last 5 Encounters:  03/20/24 207 lb (93.9 kg)  09/16/23 208 lb 3.2 oz (94.4 kg)  03/19/23 208 lb (94.3 kg)  09/17/22 199 lb 3.2 oz (90.4 kg)  07/15/22 193 lb 9.6 oz (87.8 kg)     Physical Exam Vitals and nursing note reviewed.  Constitutional:      General: He is not in acute distress.    Appearance: He is well-developed.  Cardiovascular:     Rate and Rhythm: Normal rate and regular rhythm.  Pulmonary:     Effort: Pulmonary effort is normal.     Breath sounds: Normal breath sounds.  Musculoskeletal:     Right knee: No swelling. Decreased range of motion. Tenderness present.     Left knee: No swelling. Decreased range of motion. Tenderness present.  Skin:    General: Skin is warm and dry.  Neurological:     Mental Status: He is alert and oriented to person, place, and time.       Assessment & Plan:   Essential hypertension -     Comprehensive metabolic panel with  GFR -     CBC -     amLODIPine  Besylate; Take 1 tablet (10 mg total) by mouth at bedtime.  Dispense: 90 tablet; Refill: 3 -     hydroCHLOROthiazide ; Take 1 tablet (25 mg total) by mouth daily.  Dispense: 90 tablet; Refill: 1  Chronic pain of both knees -     Ketorolac Tromethamine -     predniSONE ; Take 1 tablet (20 mg total) by mouth daily with breakfast.  Dispense: 5 tablet; Refill: 0  Chronic bilateral low back pain with bilateral sciatica -     Ketorolac Tromethamine -     predniSONE ; Take 1 tablet (20 mg total) by mouth daily with breakfast.  Dispense: 5 tablet; Refill: 0  Other orders -     Atorvastatin  Calcium ; Take 1 tablet (20 mg total) by mouth daily.  Dispense: 90 tablet; Refill: 1 -  Ondansetron; Take 1 tablet (4 mg total) by mouth every 8 (eight) hours as needed.  Dispense: 20 tablet; Refill: 0     Return in about 6 months (around 09/17/2024).      Bascom GORMAN Borer, NP 03/20/2024

## 2024-03-21 ENCOUNTER — Other Ambulatory Visit: Payer: Self-pay

## 2024-03-21 LAB — COMPREHENSIVE METABOLIC PANEL WITH GFR
ALT: 34 IU/L (ref 0–44)
AST: 23 IU/L (ref 0–40)
Albumin: 4.9 g/dL (ref 3.9–4.9)
Alkaline Phosphatase: 167 IU/L — ABNORMAL HIGH (ref 47–123)
BUN/Creatinine Ratio: 12 (ref 10–24)
BUN: 17 mg/dL (ref 8–27)
Bilirubin Total: 0.2 mg/dL (ref 0.0–1.2)
CO2: 24 mmol/L (ref 20–29)
Calcium: 10.6 mg/dL — ABNORMAL HIGH (ref 8.6–10.2)
Chloride: 101 mmol/L (ref 96–106)
Creatinine, Ser: 1.4 mg/dL — ABNORMAL HIGH (ref 0.76–1.27)
Globulin, Total: 2.5 g/dL (ref 1.5–4.5)
Glucose: 150 mg/dL — ABNORMAL HIGH (ref 70–99)
Potassium: 4.2 mmol/L (ref 3.5–5.2)
Sodium: 141 mmol/L (ref 134–144)
Total Protein: 7.4 g/dL (ref 6.0–8.5)
eGFR: 56 mL/min/1.73 — ABNORMAL LOW (ref >59)

## 2024-03-21 LAB — CBC
Hematocrit: 45.3 % (ref 37.5–51.0)
Hemoglobin: 14.4 g/dL (ref 13.0–17.7)
MCH: 27.6 pg (ref 26.6–33.0)
MCHC: 31.8 g/dL (ref 31.5–35.7)
MCV: 87 fL (ref 79–97)
Platelets: 252 x10E3/uL (ref 150–450)
RBC: 5.21 x10E6/uL (ref 4.14–5.80)
RDW: 14.6 % (ref 11.6–15.4)
WBC: 7.9 x10E3/uL (ref 3.4–10.8)

## 2024-03-22 ENCOUNTER — Ambulatory Visit: Payer: Self-pay | Admitting: Nurse Practitioner

## 2024-03-28 ENCOUNTER — Other Ambulatory Visit: Payer: Self-pay | Admitting: Nurse Practitioner

## 2024-03-28 ENCOUNTER — Other Ambulatory Visit: Payer: Self-pay

## 2024-03-28 DIAGNOSIS — G8929 Other chronic pain: Secondary | ICD-10-CM

## 2024-03-28 NOTE — Telephone Encounter (Signed)
 Please advise [facility]

## 2024-03-29 ENCOUNTER — Other Ambulatory Visit: Payer: Self-pay

## 2024-03-29 MED ORDER — PREDNISONE 20 MG PO TABS
20.0000 mg | ORAL_TABLET | Freq: Every day | ORAL | 0 refills | Status: DC
  Filled 2024-03-29: qty 5, 5d supply, fill #0

## 2024-04-03 ENCOUNTER — Other Ambulatory Visit: Payer: Self-pay

## 2024-04-03 ENCOUNTER — Other Ambulatory Visit: Payer: Self-pay | Admitting: Nurse Practitioner

## 2024-04-04 ENCOUNTER — Other Ambulatory Visit: Payer: Self-pay

## 2024-04-04 NOTE — Telephone Encounter (Signed)
 Please advise [facility]

## 2024-04-05 ENCOUNTER — Other Ambulatory Visit: Payer: Self-pay

## 2024-04-05 MED ORDER — ONDANSETRON 4 MG PO TBDP
4.0000 mg | ORAL_TABLET | Freq: Three times a day (TID) | ORAL | 0 refills | Status: AC | PRN
  Filled 2024-04-05: qty 20, 7d supply, fill #0

## 2024-04-06 ENCOUNTER — Other Ambulatory Visit: Payer: Self-pay

## 2024-04-06 ENCOUNTER — Other Ambulatory Visit: Payer: Self-pay | Admitting: Nurse Practitioner

## 2024-04-06 DIAGNOSIS — K649 Unspecified hemorrhoids: Secondary | ICD-10-CM

## 2024-04-06 DIAGNOSIS — G8929 Other chronic pain: Secondary | ICD-10-CM

## 2024-04-07 ENCOUNTER — Telehealth: Payer: Self-pay | Admitting: Nurse Practitioner

## 2024-04-07 ENCOUNTER — Other Ambulatory Visit: Payer: Self-pay

## 2024-04-07 NOTE — Telephone Encounter (Signed)
 Copied from CRM [id number]. Topic: Clinical - Prescription Issue >> Apr 07, 2024  2:46 PM Joesph B wrote: Reason for CRM: Patient is calling about medications predniSONE  (DELTASONE ) 20 MG tablet [[id number]]   hydrocortisone  (ANUSOL -HC) 25 MG suppository [[id number]]   Valsartan  160 MG.  A request for these medications were sent on 03/28/24 and still pending. Patient needs these medications. Can someone fu with provider? Patient is out of medication.

## 2024-04-10 ENCOUNTER — Other Ambulatory Visit: Payer: Self-pay | Admitting: Nurse Practitioner

## 2024-04-10 ENCOUNTER — Other Ambulatory Visit: Payer: Self-pay

## 2024-04-10 DIAGNOSIS — G8929 Other chronic pain: Secondary | ICD-10-CM

## 2024-04-10 DIAGNOSIS — K649 Unspecified hemorrhoids: Secondary | ICD-10-CM

## 2024-04-11 ENCOUNTER — Other Ambulatory Visit: Payer: Self-pay

## 2024-04-11 MED ORDER — PREDNISONE 20 MG PO TABS
20.0000 mg | ORAL_TABLET | Freq: Every day | ORAL | 0 refills | Status: DC
  Filled 2024-04-11: qty 5, 5d supply, fill #0

## 2024-04-11 MED ORDER — HYDROCORTISONE ACETATE 25 MG RE SUPP
25.0000 mg | Freq: Two times a day (BID) | RECTAL | 0 refills | Status: DC
  Filled 2024-04-11: qty 12, 6d supply, fill #0

## 2024-04-11 MED ORDER — VALSARTAN 160 MG PO TABS
160.0000 mg | ORAL_TABLET | Freq: Every day | ORAL | 0 refills | Status: DC
  Filled 2024-04-11: qty 30, 30d supply, fill #0

## 2024-04-11 NOTE — Telephone Encounter (Signed)
 Please send in med requested below. KH

## 2024-04-17 ENCOUNTER — Other Ambulatory Visit: Payer: Self-pay

## 2024-04-17 ENCOUNTER — Other Ambulatory Visit: Payer: Self-pay | Admitting: Nurse Practitioner

## 2024-04-17 DIAGNOSIS — G8929 Other chronic pain: Secondary | ICD-10-CM

## 2024-04-17 DIAGNOSIS — K649 Unspecified hemorrhoids: Secondary | ICD-10-CM

## 2024-04-17 MED ORDER — VALSARTAN 160 MG PO TABS
160.0000 mg | ORAL_TABLET | Freq: Every day | ORAL | 0 refills | Status: DC
  Filled 2024-04-17 – 2024-05-08 (×2): qty 30, 30d supply, fill #0

## 2024-04-17 MED ORDER — HYDROCORTISONE ACETATE 25 MG RE SUPP
25.0000 mg | Freq: Two times a day (BID) | RECTAL | 0 refills | Status: DC
  Filled 2024-04-17: qty 12, 6d supply, fill #0

## 2024-04-17 MED ORDER — PREDNISONE 20 MG PO TABS
20.0000 mg | ORAL_TABLET | Freq: Every day | ORAL | 0 refills | Status: AC
  Filled 2024-04-17: qty 5, 5d supply, fill #0

## 2024-04-18 ENCOUNTER — Other Ambulatory Visit: Payer: Self-pay

## 2024-04-25 ENCOUNTER — Other Ambulatory Visit: Payer: Self-pay

## 2024-04-25 ENCOUNTER — Other Ambulatory Visit: Payer: Self-pay | Admitting: Nurse Practitioner

## 2024-04-25 DIAGNOSIS — G8929 Other chronic pain: Secondary | ICD-10-CM

## 2024-04-25 NOTE — Telephone Encounter (Signed)
 Please advise [facility]

## 2024-04-27 ENCOUNTER — Telehealth: Payer: Self-pay

## 2024-04-27 ENCOUNTER — Other Ambulatory Visit: Payer: Self-pay

## 2024-04-27 NOTE — Telephone Encounter (Signed)
 Copied from CRM #[id number]. Topic: Clinical - Prescription Issue >> Apr 27, 2024 12:42 PM Delon T wrote: Reason for CRM: predniSONE  (DELTASONE ) 20 MG tablet - note in chart says refill not appropriate- wants a 30 day supply-  [phone]  Please advise [facility]

## 2024-04-28 ENCOUNTER — Telehealth: Payer: Self-pay

## 2024-04-28 DIAGNOSIS — K649 Unspecified hemorrhoids: Secondary | ICD-10-CM

## 2024-04-28 DIAGNOSIS — I10 Essential (primary) hypertension: Secondary | ICD-10-CM

## 2024-04-28 NOTE — Telephone Encounter (Signed)
 Rx #: [id number]  predniSONE  (DELTASONE ) 20 MG tablet [[id number]]   Wants a 30-day supply

## 2024-05-01 ENCOUNTER — Other Ambulatory Visit: Payer: Self-pay | Admitting: Nurse Practitioner

## 2024-05-01 ENCOUNTER — Other Ambulatory Visit: Payer: Self-pay

## 2024-05-01 DIAGNOSIS — K649 Unspecified hemorrhoids: Secondary | ICD-10-CM

## 2024-05-01 NOTE — Telephone Encounter (Unsigned)
 Copied from CRM #[id number]. Topic: Clinical - Medication Refill >> May 01, 2024 12:32 PM Olam RAMAN wrote: Medication: hydrochlorothiazide  (HYDRODIURIL ) 25 MG tablet hydrocortisone  (ANUSOL -HC) 25 MG suppository   Has the patient contacted their pharmacy? Yes (Agent: If no, request that the patient contact the pharmacy for the refill. If patient does not wish to contact the pharmacy document the reason why and proceed with request.) (Agent: If yes, when and what did the pharmacy advise?)  This is the patient's preferred pharmacy:  [facility] [address] [place] [address] Phone: [phone] Fax: [phone]  Is this the correct pharmacy for this prescription? Yes If no, delete pharmacy and type the correct one.   Has the prescription been filled recently? Yes  Is the patient out of the medication? Yes  Has the patient been seen for an appointment in the last year OR does the patient have an upcoming appointment? Yes  Can we respond through MyChart? No  Agent: Please be advised that Rx refills may take up to 3 business days. We ask that you follow-up with your pharmacy.

## 2024-05-02 ENCOUNTER — Other Ambulatory Visit: Payer: Self-pay

## 2024-05-02 ENCOUNTER — Other Ambulatory Visit: Payer: Self-pay | Admitting: Nurse Practitioner

## 2024-05-02 DIAGNOSIS — R053 Chronic cough: Secondary | ICD-10-CM

## 2024-05-02 DIAGNOSIS — I10 Essential (primary) hypertension: Secondary | ICD-10-CM

## 2024-05-02 DIAGNOSIS — Z9109 Other allergy status, other than to drugs and biological substances: Secondary | ICD-10-CM

## 2024-05-02 DIAGNOSIS — K649 Unspecified hemorrhoids: Secondary | ICD-10-CM

## 2024-05-02 MED ORDER — HYDROCHLOROTHIAZIDE 25 MG PO TABS
25.0000 mg | ORAL_TABLET | Freq: Every day | ORAL | 1 refills | Status: AC
  Filled 2024-05-02 – 2024-06-01 (×2): qty 90, 90d supply, fill #0

## 2024-05-02 NOTE — Telephone Encounter (Signed)
 Sent to pcp [facility]

## 2024-05-02 NOTE — Telephone Encounter (Signed)
 Please advise [facility]

## 2024-05-02 NOTE — Telephone Encounter (Signed)
 Spoke to pt and order for pulmonary was placed. KH

## 2024-05-03 ENCOUNTER — Other Ambulatory Visit: Payer: Self-pay

## 2024-05-03 MED ORDER — HYDROCORTISONE ACETATE 25 MG RE SUPP
25.0000 mg | Freq: Two times a day (BID) | RECTAL | 0 refills | Status: AC
  Filled 2024-05-03: qty 12, 6d supply, fill #0

## 2024-05-08 ENCOUNTER — Other Ambulatory Visit: Payer: Self-pay

## 2024-05-26 ENCOUNTER — Other Ambulatory Visit: Payer: Self-pay

## 2024-06-01 ENCOUNTER — Other Ambulatory Visit: Payer: Self-pay

## 2024-06-01 ENCOUNTER — Other Ambulatory Visit: Payer: Self-pay | Admitting: Nurse Practitioner

## 2024-06-01 ENCOUNTER — Ambulatory Visit: Payer: Self-pay | Admitting: Nurse Practitioner

## 2024-06-01 MED ORDER — VALSARTAN 160 MG PO TABS
160.0000 mg | ORAL_TABLET | Freq: Every day | ORAL | 0 refills | Status: AC
  Filled 2024-06-01: qty 30, 30d supply, fill #0

## 2024-06-01 NOTE — Telephone Encounter (Signed)
 Please Advise.  CB.

## 2024-06-01 NOTE — Telephone Encounter (Signed)
valsartan (DIOVAN) 160 MG tablet

## 2024-06-21 ENCOUNTER — Ambulatory Visit: Payer: Self-pay | Admitting: Nurse Practitioner

## 2024-07-21 ENCOUNTER — Ambulatory Visit: Admitting: Nurse Practitioner
